# Patient Record
Sex: Male | Born: 1955 | Race: White | Hispanic: No | Marital: Married | Smoking: Never smoker
Health system: Southern US, Community
[De-identification: ages and names within clinical notes are randomized; demographics above are authoritative.]

## PROBLEM LIST (undated history)

## (undated) DIAGNOSIS — K219 Gastro-esophageal reflux disease without esophagitis: Secondary | ICD-10-CM

## (undated) DIAGNOSIS — E785 Hyperlipidemia, unspecified: Secondary | ICD-10-CM

## (undated) DIAGNOSIS — I10 Essential (primary) hypertension: Secondary | ICD-10-CM

## (undated) HISTORY — DX: Essential (primary) hypertension: I10

## (undated) HISTORY — DX: Hyperlipidemia, unspecified: E78.5

## (undated) HISTORY — DX: Gastro-esophageal reflux disease without esophagitis: K21.9

---
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
## 1998-12-13 HISTORY — PX: CAROTID STENT INSERTION: SHX5766

## 2015-08-21 ENCOUNTER — Ambulatory Visit (INDEPENDENT_AMBULATORY_CARE_PROVIDER_SITE_OTHER): Payer: Managed Care, Other (non HMO) | Admitting: Internal Medicine

## 2015-08-21 ENCOUNTER — Encounter: Payer: Self-pay | Admitting: Internal Medicine

## 2015-08-21 VITALS — BP 136/86 | HR 62 | Temp 98.2°F | Ht 67.0 in | Wt 194.0 lb

## 2015-08-21 DIAGNOSIS — K219 Gastro-esophageal reflux disease without esophagitis: Secondary | ICD-10-CM | POA: Diagnosis not present

## 2015-08-21 DIAGNOSIS — I10 Essential (primary) hypertension: Secondary | ICD-10-CM | POA: Diagnosis not present

## 2015-08-21 DIAGNOSIS — E785 Hyperlipidemia, unspecified: Secondary | ICD-10-CM | POA: Diagnosis not present

## 2015-08-21 DIAGNOSIS — R21 Rash and other nonspecific skin eruption: Secondary | ICD-10-CM

## 2015-08-21 LAB — COMPREHENSIVE METABOLIC PANEL
ALBUMIN: 4.7 g/dL (ref 3.5–5.2)
ALT: 34 U/L (ref 0–53)
AST: 27 U/L (ref 0–37)
Alkaline Phosphatase: 78 U/L (ref 39–117)
BUN: 11 mg/dL (ref 6–23)
CALCIUM: 9.6 mg/dL (ref 8.4–10.5)
CHLORIDE: 103 meq/L (ref 96–112)
CO2: 29 meq/L (ref 19–32)
Creatinine, Ser: 0.88 mg/dL (ref 0.40–1.50)
GFR: 94.08 mL/min (ref >60.00)
Glucose, Bld: 94 mg/dL (ref 70–99)
POTASSIUM: 4.8 meq/L (ref 3.5–5.1)
SODIUM: 140 meq/L (ref 135–145)
Total Bilirubin: 0.9 mg/dL (ref 0.2–1.2)
Total Protein: 7.2 g/dL (ref 6.0–8.3)

## 2015-08-21 LAB — CBC
HEMATOCRIT: 48.2 % (ref 39.0–52.0)
Hemoglobin: 15.9 g/dL (ref 13.0–17.0)
MCHC: 33.1 g/dL (ref 30.0–36.0)
MCV: 93.2 fl (ref 78.0–100.0)
PLATELETS: 182 10*3/uL (ref 150.0–400.0)
RBC: 5.17 Mil/uL (ref 4.22–5.81)
RDW: 14.5 % (ref 11.5–15.5)
WBC: 7.8 10*3/uL (ref 4.0–10.5)

## 2015-08-21 LAB — LIPID PANEL
CHOL/HDL RATIO: 3
CHOLESTEROL: 165 mg/dL (ref 0–200)
HDL: 54.3 mg/dL (ref >39.00)
LDL CALC: 94 mg/dL (ref 0–99)
NonHDL: 110.76
TRIGLYCERIDES: 83 mg/dL (ref 0.0–149.0)
VLDL: 16.6 mg/dL (ref 0.0–40.0)

## 2015-08-21 MED ORDER — EZETIMIBE 10 MG PO TABS: 10.0000 mg | ORAL_TABLET | Freq: Every day | ORAL | Status: DC

## 2015-08-21 MED ORDER — TRIAMCINOLONE ACETONIDE 0.1 % EX CREA: 1.0000 "application " | TOPICAL_CREAM | Freq: Two times a day (BID) | CUTANEOUS | Status: DC

## 2015-08-21 MED ORDER — PANTOPRAZOLE SODIUM 40 MG PO TBEC: 40.0000 mg | DELAYED_RELEASE_TABLET | Freq: Every day | ORAL | Status: DC

## 2015-08-21 MED ORDER — ROSUVASTATIN CALCIUM 40 MG PO TABS: 40.0000 mg | ORAL_TABLET | Freq: Every day | ORAL | Status: DC

## 2015-08-21 NOTE — Assessment & Plan Note (Signed)
Known Barretts Esophagus Continue Protonix daily

## 2015-08-21 NOTE — Progress Notes (Signed)
HPI  Pt presents to the clinic today to establish care. He is transferring care from [facility] in [place].  HTN: BP controlled off meds. His BP today is 136/86. He does have a history of stent placement but no prior MI.  HLD: He takes Crestor and Zetia. He denies myalgias. He tries to consume a low fat diet.  GERD: He takes Protonix daily. He reports he did have a history of Barretts Esophagus. His last UGI was in 2013.  He is concerned about a rash under his left armpit. He noticed this a few weeks ago after changing deodorants from [employer] to [employer]. The rash burns. He has not tried anything OTC.  Flu: 09/2014 Tetanus: < 10 years PSA Screening: Family history of prostate cancer, gets PSA yearly Colon Screening: 2013 Vision Screening: yearly Dentist: as needed  Past Medical History  Diagnosis Date  . Hyperlipidemia   . Hypertension     Current Outpatient Prescriptions  Medication Sig Dispense Refill  . aspirin 81 MG tablet Take 81 mg by mouth daily.    . Coenzyme Q10 (CO Q 10) 100 MG CAPS Take 1 capsule by mouth daily.    Marland Kitchen ezetimibe (ZETIA) 10 MG tablet Take 10 mg by mouth daily.    . pantoprazole (PROTONIX) 40 MG tablet Take 40 mg by mouth daily.    . rosuvastatin (CRESTOR) 40 MG tablet Take 40 mg by mouth daily.     No current facility-administered medications for this visit.    No Known Allergies  Family History  Problem Relation Age of Onset  . Alzheimer's disease Mother   . Prostate cancer Father   . Hyperlipidemia Father   . Heart disease Father   . Alzheimer's disease Father     Social History   Social History  . Marital Status: Married    Spouse Name: N/A  . Number of Children: N/A  . Years of Education: N/A   Occupational History  . Not on file.   Social History Main Topics  . Smoking status: Never Smoker   . Smokeless tobacco: Not on file  . Alcohol Use: 0.0 oz/week    0 Standard drinks or equivalent per week   Comment: up to 5 times weekly---beer wine  . Drug Use: No  . Sexual Activity: Not on file   Other Topics Concern  . Not on file   Social History Narrative  . No narrative on file    ROS:  Constitutional: Denies fever, malaise, fatigue, headache or abrupt weight changes.  HEENT: Denies eye pain, eye redness, ear pain, ringing in the ears, wax buildup, runny nose, nasal congestion, bloody nose, or sore throat. Respiratory: Denies difficulty breathing, shortness of breath, cough or sputum production.   Cardiovascular: Denies chest pain, chest tightness, palpitations or swelling in the hands or feet.  Gastrointestinal: Denies abdominal pain, bloating, constipation, diarrhea or blood in the stool.  GU: Denies frequency, urgency, pain with urination, blood in urine, odor or discharge. Musculoskeletal: Denies decrease in range of motion, difficulty with gait, muscle pain or joint pain and swelling.  Skin: Pt reports rash. Denies redness, lesions or ulcercations.  Neurological: Denies dizziness, difficulty with memory, difficulty with speech or problems with balance and coordination.  Psych: Denies anxiety, depression, SI/HI.  No other specific complaints in a complete review of systems (except as listed in HPI above).  PE:  BP 136/86 mmHg  Pulse 62  Temp(Src) 98.2 F (36.8 C) (Oral)  Ht  (  1.702 m)  Wt 194 lb (87.998 kg)  BMI 30.38 kg/m2  SpO2 98% Wt Readings from Last 3 Encounters:  08/21/15 194 lb (87.998 kg)    General: Appears his stated age, well developed, well nourished in NAD. Skin: Macular scattered rash noted under left armpit. Cardiovascular: Normal rate and rhythm. S1,S2 noted.  No murmur, rubs or gallops noted. No JVD or BLE edema. No carotid bruits noted. Pulmonary/Chest: Normal effort and positive vesicular breath sounds. No respiratory distress. No wheezes, rales or ronchi noted.  Abdomen: Soft and nontender. Normal bowel sounds, no bruits noted. No distention  or masses noted. Liver, spleen and kidneys non palpable. Neurological: Alert and oriented.  Psychiatric: Mood and affect normal. Behavior is normal. Judgment and thought content normal.     Assessment and Plan:  Rash under left armpit:  eRx for Triamcinolone cream to affected area Try to avoid any deodorant until the rash resolves  RTC in 6 months for your annual exam

## 2015-08-21 NOTE — Assessment & Plan Note (Signed)
Will repeat Lipid Profile and CMET today Encouraged him to consume a low fat diet Medications refilled per request

## 2015-08-21 NOTE — Progress Notes (Signed)
Pre visit review using our clinic review tool, if applicable. No additional management support is needed unless otherwise documented below in the visit note. 

## 2015-08-21 NOTE — Patient Instructions (Signed)
Fat and Cholesterol Control Diet Fat and cholesterol levels in your blood and organs are influenced by your diet. High levels of fat and cholesterol may lead to diseases of the heart, small and large blood vessels, gallbladder, liver, and pancreas. CONTROLLING FAT AND CHOLESTEROL WITH DIET Although exercise and lifestyle factors are important, your diet is key. That is because certain foods are known to raise cholesterol and others to lower it. The goal is to balance foods for their effect on cholesterol and more importantly, to replace saturated and trans fat with other types of fat, such as monounsaturated fat, polyunsaturated fat, and omega-3 fatty acids. On average, a person should consume no more than 15 to 17 g of saturated fat daily. Saturated and trans fats are considered "bad" fats, and they will raise LDL cholesterol. Saturated fats are primarily found in animal products such as meats, butter, and cream. However, that does not mean you need to give up all your favorite foods. Today, there are good tasting, low-fat, low-cholesterol substitutes for most of the things you like to eat. Choose low-fat or nonfat alternatives. Choose round or loin cuts of red meat. These types of cuts are lowest in fat and cholesterol. Chicken (without the skin), fish, veal, and ground turkey breast are great choices. Eliminate fatty meats, such as hot dogs and salami. Even shellfish have little or no saturated fat. Have a 3 oz (85 g) portion when you eat lean meat, poultry, or fish. Trans fats are also called "partially hydrogenated oils." They are oils that have been scientifically manipulated so that they are solid at room temperature resulting in a longer shelf life and improved taste and texture of foods in which they are added. Trans fats are found in stick margarine, some tub margarines, cookies, crackers, and baked goods.  When baking and cooking, oils are a great substitute for butter. The monounsaturated oils are  especially beneficial since it is believed they lower LDL and raise HDL. The oils you should avoid entirely are saturated tropical oils, such as coconut and palm.  Remember to eat a lot from food groups that are naturally free of saturated and trans fat, including fish, fruit, vegetables, beans, grains (barley, rice, couscous, bulgur wheat), and pasta (without cream sauces).  IDENTIFYING FOODS THAT LOWER FAT AND CHOLESTEROL  Soluble fiber may lower your cholesterol. This type of fiber is found in fruits such as apples, vegetables such as broccoli, potatoes, and carrots, legumes such as beans, peas, and lentils, and grains such as barley. Foods fortified with plant sterols (phytosterol) may also lower cholesterol. You should eat at least 2 g per day of these foods for a cholesterol lowering effect.  Read package labels to identify low-saturated fats, trans fat free, and low-fat foods at the supermarket. Select cheeses that have only 2 to 3 g saturated fat per ounce. Use a heart-healthy tub margarine that is free of trans fats or partially hydrogenated oil. When buying baked goods (cookies, crackers), avoid partially hydrogenated oils. Breads and muffins should be made from whole grains (whole-wheat or whole oat flour, instead of "flour" or "enriched flour"). Buy non-creamy canned soups with reduced salt and no added fats.  FOOD PREPARATION TECHNIQUES  Never deep-fry. If you must fry, either stir-fry, which uses very little fat, or use non-stick cooking sprays. When possible, broil, bake, or roast meats, and steam vegetables. Instead of putting butter or margarine on vegetables, use lemon and herbs, applesauce, and cinnamon (for squash and sweet potatoes). Use nonfat   yogurt, salsa, and low-fat dressings for salads.  LOW-SATURATED FAT / LOW-FAT FOOD SUBSTITUTES Meats / Saturated Fat (g)  Avoid: Steak, marbled (3 oz/85 g) / 11 g  Choose: Steak, lean (3 oz/85 g) / 4 g  Avoid: Hamburger (3 oz/85 g) / 7  g  Choose: Hamburger, lean (3 oz/85 g) / 5 g  Avoid: Ham (3 oz/85 g) / 6 g  Choose: Ham, lean cut (3 oz/85 g) / 2.4 g  Avoid: Chicken, with skin, dark meat (3 oz/85 g) / 4 g  Choose: Chicken, skin removed, dark meat (3 oz/85 g) / 2 g  Avoid: Chicken, with skin, light meat (3 oz/85 g) / 2.5 g  Choose: Chicken, skin removed, light meat (3 oz/85 g) / 1 g Dairy / Saturated Fat (g)  Avoid: Whole milk (1 cup) / 5 g  Choose: Low-fat milk, 2% (1 cup) / 3 g  Choose: Low-fat milk, 1% (1 cup) / 1.5 g  Choose: Skim milk (1 cup) / 0.3 g  Avoid: Hard cheese (1 oz/28 g) / 6 g  Choose: Skim milk cheese (1 oz/28 g) / 2 to 3 g  Avoid: Cottage cheese, 4% fat (1 cup) / 6.5 g  Choose: Low-fat cottage cheese, 1% fat (1 cup) / 1.5 g  Avoid: Ice cream (1 cup) / 9 g  Choose: Sherbet (1 cup) / 2.5 g  Choose: Nonfat frozen yogurt (1 cup) / 0.3 g  Choose: Frozen fruit bar / trace  Avoid: Whipped cream (1 tbs) / 3.5 g  Choose: Nondairy whipped topping (1 tbs) / 1 g Condiments / Saturated Fat (g)  Avoid: Mayonnaise (1 tbs) / 2 g  Choose: Low-fat mayonnaise (1 tbs) / 1 g  Avoid: Butter (1 tbs) / 7 g  Choose: Extra light margarine (1 tbs) / 1 g  Avoid: Coconut oil (1 tbs) / 11.8 g  Choose: Olive oil (1 tbs) / 1.8 g  Choose: Corn oil (1 tbs) / 1.7 g  Choose: Safflower oil (1 tbs) / 1.2 g  Choose: Sunflower oil (1 tbs) / 1.4 g  Choose: Soybean oil (1 tbs) / 2.4 g  Choose: Canola oil (1 tbs) / 1 g Document Released: 11/29/2005 Document Revised: 03/26/2013 Document Reviewed: 02/27/2014 Exit[employer] Patient Information 2015 [employer]. This information is not intended to replace advice given to you by your health care provider. Make sure you discuss any questions you have with your health care provider.  

## 2015-08-21 NOTE — Assessment & Plan Note (Signed)
BP well controlled off meds Will continue to monitor Will check CBC and CMET today

## 2016-02-10 ENCOUNTER — Other Ambulatory Visit: Payer: Self-pay | Admitting: Internal Medicine

## 2016-06-15 ENCOUNTER — Other Ambulatory Visit: Payer: Self-pay | Admitting: Internal Medicine

## 2016-08-04 ENCOUNTER — Other Ambulatory Visit: Payer: Self-pay | Admitting: Internal Medicine

## 2016-10-11 ENCOUNTER — Ambulatory Visit: Payer: Managed Care, Other (non HMO) | Admitting: Internal Medicine

## 2016-10-15 ENCOUNTER — Ambulatory Visit (INDEPENDENT_AMBULATORY_CARE_PROVIDER_SITE_OTHER): Payer: 59 | Admitting: Internal Medicine

## 2016-10-15 ENCOUNTER — Encounter: Payer: Self-pay | Admitting: Internal Medicine

## 2016-10-15 VITALS — BP 134/90 | HR 65 | Temp 97.8°F | Ht 66.75 in | Wt 194.0 lb

## 2016-10-15 DIAGNOSIS — Z125 Encounter for screening for malignant neoplasm of prostate: Secondary | ICD-10-CM

## 2016-10-15 DIAGNOSIS — Z0000 Encounter for general adult medical examination without abnormal findings: Secondary | ICD-10-CM

## 2016-10-15 DIAGNOSIS — Z1159 Encounter for screening for other viral diseases: Secondary | ICD-10-CM

## 2016-10-15 DIAGNOSIS — E7800 Pure hypercholesterolemia, unspecified: Secondary | ICD-10-CM

## 2016-10-15 DIAGNOSIS — I10 Essential (primary) hypertension: Secondary | ICD-10-CM

## 2016-10-15 DIAGNOSIS — K2100 Gastro-esophageal reflux disease with esophagitis, without bleeding: Secondary | ICD-10-CM

## 2016-10-15 DIAGNOSIS — Z114 Encounter for screening for human immunodeficiency virus [HIV]: Secondary | ICD-10-CM | POA: Diagnosis not present

## 2016-10-15 LAB — CBC WITH DIFFERENTIAL/PLATELET
BASOS ABS: 0 {cells}/uL (ref 0–200)
Basophils Relative: 0 %
EOS ABS: 890 {cells}/uL — AB (ref 15–500)
Eosinophils Relative: 10 %
HEMATOCRIT: 47 % (ref 38.5–50.0)
HEMOGLOBIN: 15.7 g/dL (ref 13.2–17.1)
LYMPHS ABS: 2047 {cells}/uL (ref 850–3900)
LYMPHS PCT: 23 %
MCH: 31 pg (ref 27.0–33.0)
MCHC: 33.4 g/dL (ref 32.0–36.0)
MCV: 92.9 fL (ref 80.0–100.0)
MONO ABS: 712 {cells}/uL (ref 200–950)
MPV: 11.3 fL (ref 7.5–12.5)
Monocytes Relative: 8 %
NEUTROS PCT: 59 %
Neutro Abs: 5251 cells/uL (ref 1500–7800)
Platelets: 199 10*3/uL (ref 140–400)
RBC: 5.06 MIL/uL (ref 4.20–5.80)
RDW: 14 % (ref 11.0–15.0)
WBC: 8.9 10*3/uL (ref 3.8–10.8)

## 2016-10-15 LAB — PSA: PSA: 0.8 ng/mL (ref <=4.0)

## 2016-10-15 MED ORDER — PANTOPRAZOLE SODIUM 40 MG PO TBEC: 40.0000 mg | DELAYED_RELEASE_TABLET | Freq: Every day | ORAL | 3 refills | Status: DC

## 2016-10-15 MED ORDER — ROSUVASTATIN CALCIUM 40 MG PO TABS: 40.0000 mg | ORAL_TABLET | Freq: Every day | ORAL | 3 refills | Status: DC

## 2016-10-15 MED ORDER — EZETIMIBE 10 MG PO TABS: 10.0000 mg | ORAL_TABLET | Freq: Every day | ORAL | 3 refills | Status: DC

## 2016-10-15 NOTE — Addendum Note (Signed)
Addended by: Alvina ChouWALSH, TERRI J on: 10/15/2016 04:53 PM   Modules accepted: Orders

## 2016-10-15 NOTE — Assessment & Plan Note (Signed)
Controlled off meds  Will monitor 

## 2016-10-15 NOTE — Assessment & Plan Note (Addendum)
History of Barrett's esophagus Continue Protonix- refilled today

## 2016-10-15 NOTE — Patient Instructions (Signed)

## 2016-10-15 NOTE — Assessment & Plan Note (Addendum)
Lipid panel and CMET today Encouraged him to consume a low fat diet Continue Crestor and Zetia for now- refilled today

## 2016-10-15 NOTE — Progress Notes (Signed)
HPI  Pt presents to the clinic today for his annual exam. He is also due to follow up chronic conditions.  HTN: BP controlled off meds. His BP today is 134/90. He does have a history of stent placement but no prior MI.  HLD: His last LDL was 94, 08/2015. He takes Crestor and Zetia. He denies myalgias. He tries to consume a low fat diet.  GERD: He takes Protonix daily. He reports he does have a history of Barretts Esophagus. His last UGI was in 2013.  He reports a rash on his bilateral arms. It has not spread. It is very itchy. He was out pulling weeds recently but denies other contact irritants. He has tried Hydrocortisone Cream OTC with some.  Flu: 09/2014 Tetanus: < 10 years PSA Screening: Family history of prostate cancer, gets PSA yearly Colon Screening: 2013, every 10 years Vision Screening: yearly Dentist: as needed  Diet: He does eat meat. He consumes more veggies than fruits. He avoids fried foods. He drinks water and Dt. Doctor. Exercise: He gets 6000 steps daily, none outside of work  Past Medical History:  Diagnosis Date  . Hyperlipidemia   . Hypertension     Current Outpatient Prescriptions  Medication Sig Dispense Refill  . aspirin 81 MG tablet Take 81 mg by mouth daily.    . Coenzyme Q10 (CO Q 10) 100 MG CAPS Take 1 capsule by mouth daily.    Marland Kitchen. ezetimibe (ZETIA) 10 MG tablet Take 1 tablet (10 mg total) by mouth daily. MUST SCHEDULE ANNUAL PHYSICAL EXAM 30 tablet 1  . pantoprazole (PROTONIX) 40 MG tablet Take 1 tablet (40 mg total) by mouth daily. MUST SCHEDULE ANNUAL PHYSICAL EXAM 30 tablet 1  . rosuvastatin (CRESTOR) 40 MG tablet Take 1 tablet (40 mg total) by mouth daily. MUST SCHEDULE ANNUAL PHYSICAL EXAM 30 tablet 1  . triamcinolone cream (KENALOG) 0.1 % Apply 1 application topically 2 (two) times daily. 30 g 0   No current facility-administered medications for this visit.     No Known Allergies  Family History  Problem Relation Age of Onset  . Alzheimer's  disease Mother   . Prostate cancer Father   . Hyperlipidemia Father   . Heart disease Father   . Alzheimer's disease Father     Social History   Social History  . Marital status: Married    Spouse name: N/A  . Number of children: N/A  . Years of education: N/A   Occupational History  . Not on file.   Social History Main Topics  . Smoking status: Never Smoker  . Smokeless tobacco: Not on file  . Alcohol use 0.0 oz/week     Comment: up to 5 times weekly---beer wine  . Drug use: No  . Sexual activity: Yes   Other Topics Concern  . Not on file   Social History Narrative  . No narrative on file    ROS:  Constitutional: Denies fever, malaise, fatigue, headache or abrupt weight changes.  HEENT: Denies eye pain, eye redness, ear pain, ringing in the ears, wax buildup, runny nose, nasal congestion, bloody nose, or sore throat. Respiratory: Denies difficulty breathing, shortness of breath, cough or sputum production.   Cardiovascular: Denies chest pain, chest tightness, palpitations or swelling in the hands or feet.  Gastrointestinal: Denies abdominal pain, bloating, constipation, diarrhea or blood in the stool.  GU: Denies frequency, urgency, pain with urination, blood in urine, odor or discharge. Musculoskeletal: Denies decrease in range of motion, difficulty with  gait, muscle pain or joint pain and swelling.  Skin: Pt reports rash. Denies redness, lesions or ulcercations.  Neurological: Denies dizziness, difficulty with memory, difficulty with speech or problems with balance and coordination.  Psych: Denies anxiety, depression, SI/HI.  No other specific complaints in a complete review of systems (except as listed in HPI above).  PE: BP 134/90   Pulse 65   Temp 97.8 F (36.6 C) (Oral)   Ht 5' 6.75" (1.695 m)   Wt 194 lb (88 kg)   SpO2 98%   BMI 30.61 kg/m  Wt Readings from Last 3 Encounters:  10/15/16 194 lb (88 kg)  08/21/15 194 lb (88 kg)    General: Appears  his stated age, well developed, well nourished in NAD. Skin: Scattered, papular lesion noted on bilateral forearms.  HEENT: Head: normal shape and size; Eyes: sclera white, no icterus, conjunctiva pink, PERRLA and EOMs intact; Ears: Tm's gray and intact, normal light reflex; Throat/Mouth: Teeth present, mucosa pink and moist, no exudate, lesions or ulcerations noted.  Neck:  Neck supple, trachea midline. No masses, lumps or thyromegaly present.  Cardiovascular: Normal rate and rhythm. S1,S2 noted.  No murmur, rubs or gallops noted. No JVD or BLE edema. No carotid bruits noted. Pulmonary/Chest: Normal effort and positive vesicular breath sounds. No respiratory distress. No wheezes, rales or ronchi noted.  Abdomen: Soft and nontender. Normal bowel sounds. No distention or masses noted. Liver, spleen and kidneys non palpable. Musculoskeletal: Normal range of motion. Strength 5/5 BUE/BLE. No difficulty with gait.  Neurological: Alert and oriented. Cranial nerves II-XII grossly intact. Coordination normal.  Psychiatric: Mood and affect normal. Behavior is normal. Judgment and thought content normal.     BMET    Component Value Date/Time   NA 140 08/21/2015 1017   K 4.8 08/21/2015 1017   CL 103 08/21/2015 1017   CO2 29 08/21/2015 1017   GLUCOSE 94 08/21/2015 1017   BUN 11 08/21/2015 1017   CREATININE 0.88 08/21/2015 1017   CALCIUM 9.6 08/21/2015 1017    Lipid Panel     Component Value Date/Time   CHOL 165 08/21/2015 1017   TRIG 83.0 08/21/2015 1017   HDL 54.30 08/21/2015 1017   CHOLHDL 3 08/21/2015 1017   VLDL 16.6 08/21/2015 1017   LDLCALC 94 08/21/2015 1017    CBC    Component Value Date/Time   WBC 7.8 08/21/2015 1017   RBC 5.17 08/21/2015 1017   HGB 15.9 08/21/2015 1017   HCT 48.2 08/21/2015 1017   PLT 182.0 08/21/2015 1017   MCV 93.2 08/21/2015 1017   MCHC 33.1 08/21/2015 1017   RDW 14.5 08/21/2015 1017    Hgb A1C No results found for: HGBA1C       Assessment  and Plan:  He declines flu shot today He is sure his tetanus UTD Colon Screening UTD Encouraged him to consume a balanced diet and exercise program Advised him to see an eye doctor and dentist annually Will check CBC, CMET, Lipid, PSA, HIV and Hep C today  RTC in 1 year sooner if needed Nicki ReaperBAITY, Nareh Matzke, NP

## 2016-10-16 LAB — COMPREHENSIVE METABOLIC PANEL
ALBUMIN: 4.6 g/dL (ref 3.6–5.1)
ALT: 37 U/L (ref 9–46)
AST: 26 U/L (ref 10–35)
Alkaline Phosphatase: 61 U/L (ref 40–115)
BILIRUBIN TOTAL: 0.7 mg/dL (ref 0.2–1.2)
BUN: 13 mg/dL (ref 7–25)
CHLORIDE: 103 mmol/L (ref 98–110)
CO2: 25 mmol/L (ref 20–31)
CREATININE: 0.88 mg/dL (ref 0.70–1.25)
Calcium: 9.4 mg/dL (ref 8.6–10.3)
Glucose, Bld: 91 mg/dL (ref 65–99)
Potassium: 4.3 mmol/L (ref 3.5–5.3)
SODIUM: 139 mmol/L (ref 135–146)
Total Protein: 7.1 g/dL (ref 6.1–8.1)

## 2016-10-16 LAB — LIPID PANEL
CHOL/HDL RATIO: 2.9 ratio (ref <=5.0)
Cholesterol: 153 mg/dL (ref 125–200)
HDL: 52 mg/dL (ref >=40)
LDL CALC: 84 mg/dL (ref <130)
Triglycerides: 83 mg/dL (ref <150)
VLDL: 17 mg/dL (ref <30)

## 2016-10-16 LAB — HIV ANTIBODY (ROUTINE TESTING W REFLEX): HIV 1&2 Ab, 4th Generation: NONREACTIVE

## 2016-10-16 LAB — HEPATITIS C ANTIBODY: HCV AB: NEGATIVE

## 2017-10-10 ENCOUNTER — Telehealth: Payer: Self-pay | Admitting: Internal Medicine

## 2017-10-10 NOTE — Telephone Encounter (Signed)
Pt requesting for Meds refill. Noted pt needed yearly physical to fill prescriptions. Agent Leane Calleresa Hayes attempted to get appt for pt but nothing available. Message sent to office that pt needs refills and physical

## 2017-10-10 NOTE — Telephone Encounter (Signed)
Copied from CRM #[id number]. Topic: Inquiry >> Oct 10, 2017  3:32 PM Raquel SarnaHayes, Teresa G wrote: Reason for CRM:   Pt wants to know if he needs to be seen by Dr. Sampson SiBaity to have refills for his medications below.  Please call pt back with answer Ezetimibe Tantotrazole Rosuvastatin

## 2017-10-10 NOTE — Telephone Encounter (Signed)
Copied from CRM #[id number]. >> Oct 10, 2017  4:07 PM Raquel SarnaHayes, Teresa G wrote: Reason for CRM:   Provider is not available for Fasting Physical.  Needs an appointment with Dr. Sampson SiBaity for fasting physical and Rx refills  NOT AVAILABLE:  Nov 12 or week after Thanksgiving not available

## 2017-10-11 MED ORDER — ROSUVASTATIN CALCIUM 40 MG PO TABS
40.0000 mg | ORAL_TABLET | Freq: Every day | ORAL | 0 refills | Status: DC
Start: 2017-10-11 — End: 2017-11-15

## 2017-10-11 MED ORDER — PANTOPRAZOLE SODIUM 40 MG PO TBEC: 40.0000 mg | DELAYED_RELEASE_TABLET | Freq: Every day | ORAL | 0 refills | Status: DC

## 2017-10-11 MED ORDER — EZETIMIBE 10 MG PO TABS: 10.0000 mg | ORAL_TABLET | Freq: Every day | ORAL | 0 refills | Status: DC

## 2017-10-11 NOTE — Addendum Note (Signed)
Addended by: Roena MaladyEVONTENNO, Kmarion Rawl Y on: 10/11/2017 12:31 PM   Modules accepted: Orders

## 2017-10-11 NOTE — Telephone Encounter (Signed)
1 refill sent to pharmacy.

## 2017-10-11 NOTE — Telephone Encounter (Signed)
Left message asking pt to call office  °

## 2017-11-14 ENCOUNTER — Telehealth: Payer: Self-pay | Admitting: Internal Medicine

## 2017-11-14 NOTE — Telephone Encounter (Signed)
Copied from CRM [phone]. Topic: Inquiry >> Nov 14, 2017  2:22 PM Yvonna Alanisobinson, Andra M wrote: Reason for CRM: Patient called requesting refills of the following three medications:  ezetimibe (ZETIA) 10 MG tablet   pantoprazole (PROTONIX) 40 MG tablet   rosuvastatin (CRESTOR) 40 MG tablet   Patient stated that he is out of all three, but does not have an appt until 11/24/2017. Please call patient at [phone]. Thank You!!!

## 2017-11-15 ENCOUNTER — Other Ambulatory Visit: Payer: Self-pay

## 2017-11-15 MED ORDER — EZETIMIBE 10 MG PO TABS: 10.0000 mg | ORAL_TABLET | Freq: Every day | ORAL | 0 refills | Status: DC

## 2017-11-15 MED ORDER — PANTOPRAZOLE SODIUM 40 MG PO TBEC: 40.0000 mg | DELAYED_RELEASE_TABLET | Freq: Every day | ORAL | 0 refills | Status: DC

## 2017-11-15 MED ORDER — ROSUVASTATIN CALCIUM 40 MG PO TABS: 40.0000 mg | ORAL_TABLET | Freq: Every day | ORAL | 0 refills | Status: DC

## 2017-11-24 ENCOUNTER — Encounter: Payer: Self-pay | Admitting: Internal Medicine

## 2017-11-24 ENCOUNTER — Encounter: Payer: Self-pay | Admitting: *Deleted

## 2017-11-24 ENCOUNTER — Ambulatory Visit (INDEPENDENT_AMBULATORY_CARE_PROVIDER_SITE_OTHER): Payer: BLUE CROSS/BLUE SHIELD | Admitting: Internal Medicine

## 2017-11-24 ENCOUNTER — Encounter (INDEPENDENT_AMBULATORY_CARE_PROVIDER_SITE_OTHER): Payer: Self-pay

## 2017-11-24 VITALS — BP 132/80 | HR 64 | Temp 97.9°F | Ht 67.0 in | Wt 197.0 lb

## 2017-11-24 DIAGNOSIS — K2100 Gastro-esophageal reflux disease with esophagitis, without bleeding: Secondary | ICD-10-CM

## 2017-11-24 DIAGNOSIS — Z125 Encounter for screening for malignant neoplasm of prostate: Secondary | ICD-10-CM

## 2017-11-24 DIAGNOSIS — I10 Essential (primary) hypertension: Secondary | ICD-10-CM | POA: Diagnosis not present

## 2017-11-24 DIAGNOSIS — Z0000 Encounter for general adult medical examination without abnormal findings: Secondary | ICD-10-CM

## 2017-11-24 DIAGNOSIS — E7800 Pure hypercholesterolemia, unspecified: Secondary | ICD-10-CM

## 2017-11-24 LAB — CBC
HEMATOCRIT: 49.1 % (ref 39.0–52.0)
Hemoglobin: 16.3 g/dL (ref 13.0–17.0)
MCHC: 33.2 g/dL (ref 30.0–36.0)
MCV: 94.7 fl (ref 78.0–100.0)
Platelets: 188 10*3/uL (ref 150.0–400.0)
RBC: 5.18 Mil/uL (ref 4.22–5.81)
RDW: 13.9 % (ref 11.5–15.5)
WBC: 8.4 10*3/uL (ref 4.0–10.5)

## 2017-11-24 LAB — LIPID PANEL
CHOL/HDL RATIO: 3
Cholesterol: 144 mg/dL (ref 0–200)
HDL: 49.8 mg/dL (ref >39.00)
LDL Cholesterol: 69 mg/dL (ref 0–99)
NONHDL: 93.85
Triglycerides: 122 mg/dL (ref 0.0–149.0)
VLDL: 24.4 mg/dL (ref 0.0–40.0)

## 2017-11-24 LAB — COMPREHENSIVE METABOLIC PANEL
ALBUMIN: 4.6 g/dL (ref 3.5–5.2)
ALK PHOS: 58 U/L (ref 39–117)
ALT: 27 U/L (ref 0–53)
AST: 21 U/L (ref 0–37)
BUN: 11 mg/dL (ref 6–23)
CO2: 30 mEq/L (ref 19–32)
Calcium: 9.3 mg/dL (ref 8.4–10.5)
Chloride: 104 mEq/L (ref 96–112)
Creatinine, Ser: 0.86 mg/dL (ref 0.40–1.50)
GFR: 95.88 mL/min (ref >60.00)
Glucose, Bld: 102 mg/dL — ABNORMAL HIGH (ref 70–99)
POTASSIUM: 4.5 meq/L (ref 3.5–5.1)
SODIUM: 140 meq/L (ref 135–145)
TOTAL PROTEIN: 7.2 g/dL (ref 6.0–8.3)
Total Bilirubin: 1 mg/dL (ref 0.2–1.2)

## 2017-11-24 LAB — PSA: PSA: 0.83 ng/mL (ref 0.10–4.00)

## 2017-11-24 NOTE — Patient Instructions (Signed)

## 2017-11-24 NOTE — Assessment & Plan Note (Signed)
Discussed weaning Pantoprazole, but will hold off as he is still having breakthrough Pantoprazole refilled today Discussed foods to avoid, and how weight loss may improve reflux CBC and CMET today

## 2017-11-24 NOTE — Assessment & Plan Note (Signed)
Reasonable control off meds Reinforced DASH diet and exercise for weight loss CBC and CMET today Will monitor

## 2017-11-24 NOTE — Assessment & Plan Note (Signed)
CMET and Lipid profile today Encouraged him to consume a low fat diet Continue Rosuvastatin and Zetia, will adjust if needed prior to refilling for the year

## 2017-11-24 NOTE — Progress Notes (Signed)
Subjective:    Patient ID: Thomas Farley, male    DOB: November 07, 1956, 61 y.o.   MRN: [id number]  HPI  Pt presents to the clinic today for his annual exam. He is also due to follow up chronic conditions.  HTN: His BP today is 132/80. He is currently not taking any antihypertensive medication. ECG from 07/2014 reviewed.  HLD: His last LDL was 84, 10/2016. He denies myalgias on Rosuvastatin. He is taking Zetia as well. He has been trying to consume a low fat diet.  GERD with Barrett's Esophagus: His last UGI was in 2013. He is taking Pantoprazole daily as prescribed. He occasionally has breakthrough, controlled with Tums.  Flu: never Tetanus: unsure Zostovax: never Shingrix: never PSA Screening: 10/2016 Colon Screening: 2013, 10 years Vision Screening: as needed Dentist: as needed  Diet: He does eat meat. He consumes some fruits and veggies. He tries to avoid fried foods. He drinks Dt. Dr. Reino KentPepper water. Exercise: He walks for 30 minutes at least 3 days per week.  Review of Systems  Past Medical History:  Diagnosis Date  . Hyperlipidemia   . Hypertension     Current Outpatient Medications  Medication Sig Dispense Refill  . aspirin 81 MG tablet Take 81 mg by mouth daily.    . Coenzyme Q10 (CO Q 10) 100 MG CAPS Take 1 capsule by mouth daily.    Marland Kitchen. ezetimibe (ZETIA) 10 MG tablet Take 1 tablet (10 mg total) by mouth daily. 30 tablet 0  . pantoprazole (PROTONIX) 40 MG tablet Take 1 tablet (40 mg total) by mouth daily. 30 tablet 0  . rosuvastatin (CRESTOR) 40 MG tablet Take 1 tablet (40 mg total) by mouth daily. 30 tablet 0   No current facility-administered medications for this visit.     No Known Allergies  Family History  Problem Relation Age of Onset  . Alzheimer's disease Mother   . Prostate cancer Father   . Hyperlipidemia Father   . Heart disease Father   . Alzheimer's disease Father     Social History   Socioeconomic History  . Marital status: Married    Spouse  name: Not on file  . Number of children: Not on file  . Years of education: Not on file  . Highest education level: Not on file  Social Needs  . Financial resource strain: Not on file  . Food insecurity - worry: Not on file  . Food insecurity - inability: Not on file  . Transportation needs - medical: Not on file  . Transportation needs - non-medical: Not on file  Occupational History  . Not on file  Tobacco Use  . Smoking status: Never Smoker  . Smokeless tobacco: Never Used  Substance and Sexual Activity  . Alcohol use: Yes    Alcohol/week: 0.0 oz    Comment: up to 5 times weekly---beer or wine  . Drug use: No  . Sexual activity: Yes  Other Topics Concern  . Not on file  Social History Narrative  . Not on file     Constitutional: Denies fever, malaise, fatigue, headache or abrupt weight changes.  HEENT: Denies eye pain, eye redness, ear pain, ringing in the ears, wax buildup, runny nose, nasal congestion, bloody nose, or sore throat. Respiratory: Denies difficulty breathing, shortness of breath, cough or sputum production.   Cardiovascular: Denies chest pain, chest tightness, palpitations or swelling in the hands or feet.  Gastrointestinal: Pt reports intermittent reflux. Denies abdominal pain, bloating, constipation, diarrhea or blood  in the stool.  GU: Denies urgency, frequency, pain with urination, burning sensation, blood in urine, odor or discharge. Musculoskeletal: Denies decrease in range of motion, difficulty with gait, muscle pain or joint pain and swelling.  Skin: Denies redness, rashes, lesions or ulcercations.  Neurological: Denies dizziness, difficulty with memory, difficulty with speech or problems with balance and coordination.  Psych: Denies anxiety, depression, SI/HI.  No other specific complaints in a complete review of systems (except as listed in HPI above).     Objective:   Physical Exam  BP 132/80   Pulse 64   Temp 97.9 F (36.6 C) (Oral)   Ht  5\' 7"  (1.702 m)   Wt 197 lb (89.4 kg)   SpO2 98%   BMI 30.85 kg/m  Wt Readings from Last 3 Encounters:  11/24/17 197 lb (89.4 kg)  10/15/16 194 lb (88 kg)  08/21/15 194 lb (88 kg)    General: Appears his stated age, well developed, well nourished in NAD. Skin: Warm, dry and intact.  HEENT: Head: normal shape and size; Eyes: sclera white, no icterus, conjunctiva pink, PERRLA and EOMs intact; Ears: Tm's gray and intact, normal light reflex; Throat/Mouth: Teeth present, mucosa pink and moist, no exudate, lesions or ulcerations noted.  Neck:  Neck supple, trachea midline. No masses, lumps or thyromegaly present.  Cardiovascular: Normal rate and rhythm. S1,S2 noted.  No murmur, rubs or gallops noted. No JVD or BLE edema. No carotid bruits noted. Pulmonary/Chest: Normal effort and positive vesicular breath sounds. No respiratory distress. No wheezes, rales or ronchi noted.  Abdomen: Soft and nontender. Normal bowel sounds. No distention or masses noted. Liver, spleen and kidneys non palpable. Musculoskeletal: Strength 5/5 BUE/BLE. No difficulty with gait.  Neurological: Alert and oriented. Cranial nerves II-XII grossly intact. Coordination normal.  Psychiatric: Mood and affect normal. Behavior is normal. Judgment and thought content normal.    BMET    Component Value Date/Time   NA 139 10/15/2016 1653   K 4.3 10/15/2016 1653   CL 103 10/15/2016 1653   CO2 25 10/15/2016 1653   GLUCOSE 91 10/15/2016 1653   BUN 13 10/15/2016 1653   CREATININE 0.88 10/15/2016 1653   CALCIUM 9.4 10/15/2016 1653    Lipid Panel     Component Value Date/Time   CHOL 153 10/15/2016 1653   TRIG 83 10/15/2016 1653   HDL 52 10/15/2016 1653   CHOLHDL 2.9 10/15/2016 1653   VLDL 17 10/15/2016 1653   LDLCALC 84 10/15/2016 1653    CBC    Component Value Date/Time   WBC 8.9 10/15/2016 1653   RBC 5.06 10/15/2016 1653   HGB 15.7 10/15/2016 1653   HCT 47.0 10/15/2016 1653   PLT 199 10/15/2016 1653   MCV  92.9 10/15/2016 1653   MCH 31.0 10/15/2016 1653   MCHC 33.4 10/15/2016 1653   RDW 14.0 10/15/2016 1653   LYMPHSABS 2,047 10/15/2016 1653   MONOABS 712 10/15/2016 1653   EOSABS 890 (H) 10/15/2016 1653   BASOSABS 0 10/15/2016 1653    Hgb A1C No results found for: HGBA1C    Assessment & Plan:   Preventative Health Maintenance:  He declines flu shot He reports he will get his tetanus vaccine next year He will call his insurance company about the Shingrix vaccine Colon screening UTD Encouraged him to consume a balanced diet and exercise regimen Advised him to see an eye doctor and dentist annually Will check CBC, CMET, Lipid and PSA today  RTC in 1 year, sooner  if needed Webb Silversmith, NP

## 2018-01-06 ENCOUNTER — Other Ambulatory Visit: Payer: Self-pay | Admitting: Internal Medicine

## 2018-01-19 ENCOUNTER — Other Ambulatory Visit: Payer: Self-pay | Admitting: Internal Medicine

## 2018-01-20 ENCOUNTER — Other Ambulatory Visit: Payer: Self-pay

## 2018-01-20 ENCOUNTER — Telehealth: Payer: Self-pay | Admitting: Internal Medicine

## 2018-01-20 MED ORDER — ROSUVASTATIN CALCIUM 40 MG PO TABS: 40.0000 mg | ORAL_TABLET | Freq: Every day | ORAL | 2 refills | Status: DC

## 2018-01-20 MED ORDER — EZETIMIBE 10 MG PO TABS: 10.0000 mg | ORAL_TABLET | Freq: Every day | ORAL | 2 refills | Status: DC

## 2018-01-20 MED ORDER — PANTOPRAZOLE SODIUM 40 MG PO TBEC: 40.0000 mg | DELAYED_RELEASE_TABLET | Freq: Every day | ORAL | 3 refills | Status: DC

## 2018-01-20 NOTE — Telephone Encounter (Signed)
Copied from CRM [id number]. Topic: Quick Communication - Rx Refill/Question >> Jan 20, 2018  9:52 AM Floria RavelingStovall, Shana A wrote: Medication: pt needs all 3 meds on med list resent to pharmacy.  They stated that they do not have them   Has the patient contacted their pharmacy?    (Agent: If no, request that the patient contact the pharmacy for the refill.)   Preferred Pharmacy (with phone number or street name):  [facility] on [address]  Can these be recent ?     Agent: Please be advised that RX refills may take up to 3 business days. We ask that you follow-up with your pharmacy.

## 2018-01-20 NOTE — Telephone Encounter (Signed)
Refills sent as requested

## 2018-04-28 ENCOUNTER — Encounter: Payer: Self-pay | Admitting: Family Medicine

## 2018-04-28 ENCOUNTER — Ambulatory Visit (INDEPENDENT_AMBULATORY_CARE_PROVIDER_SITE_OTHER): Payer: BLUE CROSS/BLUE SHIELD | Admitting: Family Medicine

## 2018-04-28 VITALS — BP 142/90 | HR 80 | Temp 98.6°F | Ht 67.0 in | Wt 200.2 lb

## 2018-04-28 DIAGNOSIS — B9789 Other viral agents as the cause of diseases classified elsewhere: Secondary | ICD-10-CM

## 2018-04-28 DIAGNOSIS — I10 Essential (primary) hypertension: Secondary | ICD-10-CM

## 2018-04-28 DIAGNOSIS — J069 Acute upper respiratory infection, unspecified: Secondary | ICD-10-CM

## 2018-04-28 MED ORDER — BENZONATATE 200 MG PO CAPS: 200.0000 mg | ORAL_CAPSULE | Freq: Three times a day (TID) | ORAL | 1 refills | Status: DC | PRN

## 2018-04-28 MED ORDER — PROMETHAZINE-DM 6.25-15 MG/5ML PO SYRP: 5.0000 mL | ORAL_SOLUTION | Freq: Four times a day (QID) | ORAL | 0 refills | Status: DC | PRN

## 2018-04-28 NOTE — Patient Instructions (Signed)
Drink lots of fluids I think you are at the end of your viral upper respiratory infection   Try tessalon for cough three times daily  The prometh -DM at night or when not working or driving due to sedation   During the day- mucinex DM      Update if not starting to improve in a week or if worsening     Blood pressure is up - make sure to follow up with [facility] for this when you can

## 2018-04-28 NOTE — Progress Notes (Signed)
Subjective:    Patient ID: Thomas Farley, male    DOB: Nov 26, 1956, 62 y.o.   MRN: [id number]  HPI Here for uri/sinus symptoms day 9   ST Followed by pink eye - itchy red eyes   Cough hanging on and very bad at night  Makes his throat hurt  No fever  A little sinus pressure but no pain   Mucous- pnd is clear  A little green in am briefly    bp is up today BP Readings from Last 3 Encounters:  04/28/18 (!) 142/90  11/24/17 132/80  10/15/16 134/90    Over the counter- benadryl mucinex ? DM  Ibuprofen/tylenol as needed   Patient Active Problem List   Diagnosis Date Noted  . Viral URI with cough 04/28/2018  . Esophageal reflux 08/21/2015  . HLD (hyperlipidemia) 08/21/2015  . Essential hypertension 08/21/2015   Past Medical History:  Diagnosis Date  . Hyperlipidemia   . Hypertension    Past Surgical History:  Procedure Laterality Date  . CAROTID STENT INSERTION  2000  . MANDIBLE RECONSTRUCTION  1975   Social History   Tobacco Use  . Smoking status: Never Smoker  . Smokeless tobacco: Never Used  Substance Use Topics  . Alcohol use: Yes    Alcohol/week: 0.0 oz    Comment: up to 5 times weekly---beer or wine  . Drug use: No   Family History  Problem Relation Age of Onset  . Alzheimer's disease Mother   . Prostate cancer Father   . Hyperlipidemia Father   . Heart disease Father   . Alzheimer's disease Father    No Known Allergies Current Outpatient Medications on File Prior to Visit  Medication Sig Dispense Refill  . aspirin 81 MG tablet Take 81 mg by mouth daily.    Marland Kitchen ezetimibe (ZETIA) 10 MG tablet Take 1 tablet (10 mg total) by mouth daily. 90 tablet 2  . pantoprazole (PROTONIX) 40 MG tablet Take 1 tablet (40 mg total) by mouth daily. 90 tablet 3  . rosuvastatin (CRESTOR) 40 MG tablet Take 1 tablet (40 mg total) by mouth daily. 90 tablet 2   No current facility-administered medications on file prior to visit.      Review of Systems  Constitutional:  Positive for appetite change and fatigue. Negative for fever.  HENT: Positive for congestion, postnasal drip, rhinorrhea, sinus pressure, sneezing and sore throat. Negative for ear pain.   Eyes: Negative for pain and discharge.  Respiratory: Positive for cough. Negative for shortness of breath, wheezing and stridor.   Cardiovascular: Negative for chest pain.  Gastrointestinal: Negative for diarrhea, nausea and vomiting.  Genitourinary: Negative for frequency, hematuria and urgency.  Musculoskeletal: Negative for arthralgias and myalgias.  Skin: Negative for rash.  Neurological: Positive for headaches. Negative for dizziness, weakness and light-headedness.  Psychiatric/Behavioral: Negative for confusion and dysphoric mood.       Objective:   Physical Exam  Constitutional: He appears well-developed and well-nourished. No distress.  HENT:  Head: Normocephalic and atraumatic.  Right Ear: External ear normal.  Left Ear: External ear normal.  Mouth/Throat: Oropharynx is clear and moist.  Nares are injected and congested  No sinus tenderness Clear rhinorrhea and post nasal drip   Eyes: Pupils are equal, round, and reactive to light. Conjunctivae and EOM are normal. Right eye exhibits no discharge. Left eye exhibits no discharge.  Neck: Normal range of motion. Neck supple.  Cardiovascular: Normal rate and normal heart sounds.  Pulmonary/Chest: Effort normal and breath  sounds normal. No respiratory distress. He has no wheezes. He has no rales. He exhibits no tenderness.  Good air exch No wheeze even on forced exp  Lymphadenopathy:    He has no cervical adenopathy.  Neurological: He is alert.  Skin: Skin is warm and dry. No rash noted.  Psychiatric: He has a normal mood and affect.          Assessment & Plan:   Problem List Items Addressed This Visit      Cardiovascular and Mediastinum   Essential hypertension    BP: (!) 142/90    Elevated today  Will f/u with pcp         Respiratory   Viral URI with cough - Primary    Reassuring exam  Improving AVS:  Drink lots of fluids I think you are at the end of your viral upper respiratory infection   Try tessalon for cough three times daily  The prometh -DM at night or when not working or driving due to sedation  During the day- mucinex DM    Update if not starting to improve in a week or if worsening

## 2018-04-30 NOTE — Assessment & Plan Note (Signed)
BP: (!) 142/90    Elevated today  Will f/u with pcp

## 2018-04-30 NOTE — Assessment & Plan Note (Signed)
Reassuring exam  Improving AVS:  Drink lots of fluids I think you are at the end of your viral upper respiratory infection   Try tessalon for cough three times daily  The prometh -DM at night or when not working or driving due to sedation  During the day- mucinex DM    Update if not starting to improve in a week or if worsening

## 2019-01-06 ENCOUNTER — Other Ambulatory Visit: Payer: Self-pay | Admitting: Internal Medicine

## 2019-01-12 ENCOUNTER — Other Ambulatory Visit: Payer: Self-pay

## 2019-01-12 MED ORDER — PANTOPRAZOLE SODIUM 40 MG PO TBEC: 40.0000 mg | DELAYED_RELEASE_TABLET | Freq: Every day | ORAL | 0 refills | Status: DC

## 2019-02-18 ENCOUNTER — Other Ambulatory Visit: Payer: Self-pay | Admitting: Internal Medicine

## 2019-03-02 ENCOUNTER — Encounter: Payer: BLUE CROSS/BLUE SHIELD | Admitting: Internal Medicine

## 2019-04-08 ENCOUNTER — Other Ambulatory Visit: Payer: Self-pay | Admitting: Internal Medicine

## 2019-05-17 ENCOUNTER — Telehealth: Payer: Self-pay | Admitting: Internal Medicine

## 2019-05-19 NOTE — Telephone Encounter (Signed)
Pt has CPE 05/21/19... will wait

## 2019-05-21 ENCOUNTER — Encounter: Payer: BLUE CROSS/BLUE SHIELD | Admitting: Internal Medicine

## 2019-05-21 ENCOUNTER — Other Ambulatory Visit: Payer: Self-pay | Admitting: Internal Medicine

## 2019-05-21 NOTE — Telephone Encounter (Signed)
Pt called day before to reschedule. I have left a voicemail for him to call back to r/s between 8-4 and canceled 6/8 appt.  Copied from [place] [id number]. Topic: General - Other >> May 18, 2019  4:30 PM Keene Breath wrote: Reason for CRM: Patient called after hours to reschedule his appt. On Monday.  Please call patient back at [phone]

## 2019-06-06 NOTE — Telephone Encounter (Signed)
Pt called and scheduled cpe 7/13 and is out of this medication and is requesting a 90 day supply sent to pharmacy [facility], [place].

## 2019-06-07 MED ORDER — ROSUVASTATIN CALCIUM 40 MG PO TABS: 40.0000 mg | ORAL_TABLET | Freq: Every day | ORAL | 0 refills | Status: DC

## 2019-06-07 NOTE — Telephone Encounter (Signed)
Rx sent through e-scribe The others were filled in May, so should have enough

## 2019-06-07 NOTE — Addendum Note (Signed)
Addended by: Lurlean Nanny on: 06/07/2019 10:53 AM   Modules accepted: Orders

## 2019-06-20 ENCOUNTER — Telehealth: Payer: Self-pay | Admitting: Internal Medicine

## 2019-06-20 NOTE — Telephone Encounter (Signed)
Patient called back requesting his cpe on Monday be virtual. Is has scheduled to come Friday to do fasting labs before the appointment.,   Could you order the labs for this appt.

## 2019-06-20 NOTE — Telephone Encounter (Signed)
I do not order labs prior to my physicals. Labs will be ordered at his appt.

## 2019-06-20 NOTE — Telephone Encounter (Signed)
Pt was notified.  

## 2019-06-22 ENCOUNTER — Other Ambulatory Visit: Payer: BC Managed Care – PPO

## 2019-06-25 ENCOUNTER — Encounter: Payer: Self-pay | Admitting: Internal Medicine

## 2019-06-25 ENCOUNTER — Ambulatory Visit (INDEPENDENT_AMBULATORY_CARE_PROVIDER_SITE_OTHER): Payer: BC Managed Care – PPO | Admitting: Internal Medicine

## 2019-06-25 VITALS — Ht 67.0 in | Wt 192.0 lb

## 2019-06-25 DIAGNOSIS — Z125 Encounter for screening for malignant neoplasm of prostate: Secondary | ICD-10-CM

## 2019-06-25 DIAGNOSIS — K2100 Gastro-esophageal reflux disease with esophagitis, without bleeding: Secondary | ICD-10-CM

## 2019-06-25 DIAGNOSIS — Z0000 Encounter for general adult medical examination without abnormal findings: Secondary | ICD-10-CM | POA: Diagnosis not present

## 2019-06-25 DIAGNOSIS — I10 Essential (primary) hypertension: Secondary | ICD-10-CM

## 2019-06-25 DIAGNOSIS — E7800 Pure hypercholesterolemia, unspecified: Secondary | ICD-10-CM | POA: Diagnosis not present

## 2019-06-25 NOTE — Progress Notes (Signed)
Subjective:    Patient ID: Thomas Farley Fauth, male    DOB: January 21, 1956, 63 y.o.   MRN: [id number] Virtual Visit via Video Note  I connected with Thomas Starcherurtis Farley Wimes on 06/25/19 at 12:15 PM EDT by a video enabled telemedicine application and verified that I am speaking with the correct person using two identifiers.  Location: Patient: Home Provider: Office   I discussed the limitations of evaluation and management by telemedicine and the availability of in person appointments. The patient expressed understanding and agreed to proceed   HPI  Pt presents to the clinic today for his annual exam. He is also due to follow up chronic conditions.   GERD: He only has breakthrough if he misses a dose of Pantoprazole. He reports he has a history of Barretts Esophagus. There is no upper GI on file but he reports he has had one in the past.  HTN: He does not monitor his BP at home. He is currently not taking any antihypertensive medication.   HLD: His last LDL was 84, 10/2016. He denies myalgias on Rosuvastatin. He is taking Zetia, CoQ10 and ASA as well. He tries to consume a low fat diet.   Flu: never Tetanus: > 10 years ago Zostavax: never Shingrix: never PSA Screening: 10/2016 Colon Screening: < 10 years ago Vision Screening: annually Dentist: annually  Diet: He does eat meat. He consumes fruits and veggies daily. He tries to avoid fried foods. He drinks mostly coffee and water. Exercise: None  Review of Systems      Past Medical History:  Diagnosis Date  . Hyperlipidemia   . Hypertension     Current Outpatient Medications  Medication Sig Dispense Refill  . aspirin 81 MG tablet Take 81 mg by mouth daily.    Marland Kitchen. co-enzyme Q-10 30 MG capsule Take 30 mg by mouth 3 (three) times daily.    Marland Kitchen. ezetimibe (ZETIA) 10 MG tablet TAKE 1 TABLET BY MOUTH EVERY DAY 90 tablet 0  . Multiple Vitamins-Minerals (MENS MULTI VITAMIN & MINERAL PO) Take 1 tablet by mouth daily.    . pantoprazole  (PROTONIX) 40 MG tablet TAKE 1 TABLET(40 MG) BY MOUTH DAILY 90 tablet 0  . rosuvastatin (CRESTOR) 40 MG tablet Take 1 tablet (40 mg total) by mouth daily. 90 tablet 0   No current facility-administered medications for this visit.     No Known Allergies  Family History  Problem Relation Age of Onset  . Alzheimer's disease Mother   . Prostate cancer Father   . Hyperlipidemia Father   . Heart disease Father   . Alzheimer's disease Father     Social History   Socioeconomic History  . Marital status: Married    Spouse name: Not on file  . Number of children: Not on file  . Years of education: Not on file  . Highest education level: Not on file  Occupational History  . Not on file  Social Needs  . Financial resource strain: Not on file  . Food insecurity    Worry: Not on file    Inability: Not on file  . Transportation needs    Medical: Not on file    Non-medical: Not on file  Tobacco Use  . Smoking status: Never Smoker  . Smokeless tobacco: Never Used  Substance and Sexual Activity  . Alcohol use: Yes    Alcohol/week: 0.0 standard drinks    Comment: up to 5 times weekly---beer or wine  . Drug use: No  .  Sexual activity: Yes  Lifestyle  . Physical activity    Days per week: Not on file    Minutes per session: Not on file  . Stress: Not on file  Relationships  . Social Musicianconnections    Talks on phone: Not on file    Gets together: Not on file    Attends religious service: Not on file    Active member of club or organization: Not on file    Attends meetings of clubs or organizations: Not on file    Relationship status: Not on file  . Intimate partner violence    Fear of current or ex partner: Not on file    Emotionally abused: Not on file    Physically abused: Not on file    Forced sexual activity: Not on file  Other Topics Concern  . Not on file  Social History Narrative  . Not on file     Constitutional: Denies fever, malaise, fatigue, headache or abrupt  weight changes.  HEENT: Denies eye pain, eye redness, ear pain, ringing in the ears, wax buildup, runny nose, nasal congestion, bloody nose, or sore throat. Respiratory: Denies difficulty breathing, shortness of breath, cough or sputum production.   Cardiovascular: Denies chest pain, chest tightness, palpitations or swelling in the hands or feet.  Gastrointestinal: Denies abdominal pain, bloating, constipation, diarrhea or blood in the stool.  GU: Denies urgency, frequency, pain with urination, burning sensation, blood in urine, odor or discharge. Musculoskeletal: Denies decrease in range of motion, difficulty with gait, muscle pain or joint pain and swelling.  Skin: Denies redness, rashes, lesions or ulcercations.  Neurological: Denies dizziness, difficulty with memory, difficulty with speech or problems with balance and coordination.  Psych: Denies anxiety, depression, SI/HI.  No other specific complaints in a complete review of systems (except as listed in HPI above).  Objective:   Physical Exam   Ht 5\' 7"  (1.702 m)   Wt 192 lb (87.1 kg) Comment: pt reported  BMI 30.07 kg/m  Wt Readings from Last 3 Encounters:  06/25/19 192 lb (87.1 kg)  04/28/18 200 lb 4 oz (90.8 kg)  11/24/17 197 lb (89.4 kg)    General: Appears his stated age, well developed, well nourished in NAD. Skin: Warm, dry and intact. No rashes noted. HEENT: Head: normal shape and size; Eyes: sclera white, no icterus, conjunctiva pink, PERRLA and EOMs intact;  Pulmonary/Chest: Normal effort. No respiratory distress.  Neurological: Alert and oriented.  Psychiatric: Mood and affect normal. Behavior is normal. Judgment and thought content normal.     BMET    Component Value Date/Time   NA 140 11/24/2017 0818   K 4.5 11/24/2017 0818   CL 104 11/24/2017 0818   CO2 30 11/24/2017 0818   GLUCOSE 102 (H) 11/24/2017 0818   BUN 11 11/24/2017 0818   CREATININE 0.86 11/24/2017 0818   CREATININE 0.88 10/15/2016 1653    CALCIUM 9.3 11/24/2017 0818    Lipid Panel     Component Value Date/Time   CHOL 144 11/24/2017 0818   TRIG 122.0 11/24/2017 0818   HDL 49.80 11/24/2017 0818   CHOLHDL 3 11/24/2017 0818   VLDL 24.4 11/24/2017 0818   LDLCALC 69 11/24/2017 0818    CBC    Component Value Date/Time   WBC 8.4 11/24/2017 0818   RBC 5.18 11/24/2017 0818   HGB 16.3 11/24/2017 0818   HCT 49.1 11/24/2017 0818   PLT 188.0 11/24/2017 0818   MCV 94.7 11/24/2017 0818   MCH 31.0  10/15/2016 1653   MCHC 33.2 11/24/2017 0818   RDW 13.9 11/24/2017 0818   LYMPHSABS 2,047 10/15/2016 1653   MONOABS 712 10/15/2016 1653   EOSABS 890 (H) 10/15/2016 1653   BASOSABS 0 10/15/2016 1653    Hgb A1C No results found for: HGBA1C         Assessment & Plan:   Preventative Health Maintenance:  Encouraged him to get a flu shot in the fall He will make nurse visit for BP check and Tetanus (Td) He declines referral for colon cancer screening at this time Encouraged him to consume a balanced diet and exercise regimen Advised him to see an eye doctor and dentist annually Will check CBC, CMET, Lipid, PSA  RTC in 1 year, sooner if needed Webb Silversmith, NP

## 2019-06-25 NOTE — Patient Instructions (Signed)

## 2019-06-25 NOTE — Assessment & Plan Note (Signed)
Will have him schedule lab only appt for CMET and lipid profile Encouraged him to consume a low fat diet Continue Rosuvastatin, Zetia, Coq10 and ASA

## 2019-06-25 NOTE — Assessment & Plan Note (Addendum)
Will have him make nurse visit for BP check Will have him make lab only appt for CMET Encouraged him to consume a DASH diet

## 2019-06-25 NOTE — Assessment & Plan Note (Signed)
Continue Pantoprazole 40 mg daily (no wean d/t history of Barrett's Esophagus) Will have him schedule nurse visit for CMET and CBC Discussed avoiding foods that trigger reflux

## 2019-07-03 DIAGNOSIS — H33312 Horseshoe tear of retina without detachment, left eye: Secondary | ICD-10-CM | POA: Diagnosis not present

## 2019-07-09 ENCOUNTER — Other Ambulatory Visit: Payer: Self-pay | Admitting: Internal Medicine

## 2019-07-13 ENCOUNTER — Other Ambulatory Visit: Payer: Self-pay | Admitting: Internal Medicine

## 2019-08-07 ENCOUNTER — Encounter: Payer: Self-pay | Admitting: Internal Medicine

## 2019-09-02 ENCOUNTER — Other Ambulatory Visit: Payer: Self-pay | Admitting: Internal Medicine

## 2019-11-27 ENCOUNTER — Other Ambulatory Visit: Payer: Self-pay | Admitting: Internal Medicine

## 2019-11-28 ENCOUNTER — Other Ambulatory Visit: Payer: Self-pay | Admitting: Internal Medicine

## 2019-12-04 ENCOUNTER — Other Ambulatory Visit (INDEPENDENT_AMBULATORY_CARE_PROVIDER_SITE_OTHER): Payer: BC Managed Care – PPO

## 2019-12-04 DIAGNOSIS — I10 Essential (primary) hypertension: Secondary | ICD-10-CM | POA: Diagnosis not present

## 2019-12-04 DIAGNOSIS — K2100 Gastro-esophageal reflux disease with esophagitis, without bleeding: Secondary | ICD-10-CM

## 2019-12-04 DIAGNOSIS — E7800 Pure hypercholesterolemia, unspecified: Secondary | ICD-10-CM

## 2019-12-04 DIAGNOSIS — Z125 Encounter for screening for malignant neoplasm of prostate: Secondary | ICD-10-CM

## 2019-12-05 LAB — LIPID PANEL
Cholesterol: 295 mg/dL — ABNORMAL HIGH (ref 0–200)
HDL: 50.2 mg/dL (ref >39.00)
LDL Cholesterol: 218 mg/dL — ABNORMAL HIGH (ref 0–99)
NonHDL: 244.85
Total CHOL/HDL Ratio: 6
Triglycerides: 134 mg/dL (ref 0.0–149.0)
VLDL: 26.8 mg/dL (ref 0.0–40.0)

## 2019-12-05 LAB — COMPREHENSIVE METABOLIC PANEL
ALT: 25 U/L (ref 0–53)
AST: 20 U/L (ref 0–37)
Albumin: 4.6 g/dL (ref 3.5–5.2)
Alkaline Phosphatase: 68 U/L (ref 39–117)
BUN: 12 mg/dL (ref 6–23)
CO2: 25 mEq/L (ref 19–32)
Calcium: 9.6 mg/dL (ref 8.4–10.5)
Chloride: 102 mEq/L (ref 96–112)
Creatinine, Ser: 0.87 mg/dL (ref 0.40–1.50)
GFR: 88.43 mL/min (ref >60.00)
Glucose, Bld: 95 mg/dL (ref 70–99)
Potassium: 4.4 mEq/L (ref 3.5–5.1)
Sodium: 139 mEq/L (ref 135–145)
Total Bilirubin: 0.7 mg/dL (ref 0.2–1.2)
Total Protein: 7 g/dL (ref 6.0–8.3)

## 2019-12-05 LAB — CBC
HCT: 47.4 % (ref 39.0–52.0)
Hemoglobin: 15.9 g/dL (ref 13.0–17.0)
MCHC: 33.6 g/dL (ref 30.0–36.0)
MCV: 93.8 fl (ref 78.0–100.0)
Platelets: 194 10*3/uL (ref 150.0–400.0)
RBC: 5.05 Mil/uL (ref 4.22–5.81)
RDW: 14.6 % (ref 11.5–15.5)
WBC: 8.8 10*3/uL (ref 4.0–10.5)

## 2019-12-05 LAB — PSA: PSA: 1.04 ng/mL (ref 0.10–4.00)

## 2019-12-06 ENCOUNTER — Encounter: Payer: Self-pay | Admitting: Internal Medicine

## 2019-12-06 DIAGNOSIS — E7800 Pure hypercholesterolemia, unspecified: Secondary | ICD-10-CM

## 2019-12-10 MED ORDER — EZETIMIBE 10 MG PO TABS: 10.0000 mg | ORAL_TABLET | Freq: Every day | ORAL | 0 refills | Status: DC

## 2019-12-10 MED ORDER — ROSUVASTATIN CALCIUM 40 MG PO TABS: 40.0000 mg | ORAL_TABLET | Freq: Every day | ORAL | 0 refills | Status: DC

## 2019-12-31 ENCOUNTER — Other Ambulatory Visit: Payer: Self-pay | Admitting: Internal Medicine

## 2020-03-05 DIAGNOSIS — Z23 Encounter for immunization: Secondary | ICD-10-CM | POA: Diagnosis not present

## 2020-03-30 ENCOUNTER — Other Ambulatory Visit: Payer: Self-pay | Admitting: Internal Medicine

## 2020-04-03 DIAGNOSIS — Z23 Encounter for immunization: Secondary | ICD-10-CM | POA: Diagnosis not present

## 2020-06-28 ENCOUNTER — Other Ambulatory Visit: Payer: Self-pay | Admitting: Internal Medicine

## 2020-09-26 ENCOUNTER — Other Ambulatory Visit: Payer: Self-pay | Admitting: Internal Medicine

## 2020-10-23 ENCOUNTER — Ambulatory Visit: Payer: BC Managed Care – PPO | Admitting: Internal Medicine

## 2020-10-26 ENCOUNTER — Other Ambulatory Visit: Payer: Self-pay | Admitting: Internal Medicine

## 2020-10-28 ENCOUNTER — Other Ambulatory Visit: Payer: Self-pay

## 2020-10-28 ENCOUNTER — Ambulatory Visit (INDEPENDENT_AMBULATORY_CARE_PROVIDER_SITE_OTHER): Payer: BC Managed Care – PPO | Admitting: Internal Medicine

## 2020-10-28 ENCOUNTER — Ambulatory Visit (INDEPENDENT_AMBULATORY_CARE_PROVIDER_SITE_OTHER)
Admission: RE | Admit: 2020-10-28 | Discharge: 2020-10-28 | Disposition: A | Discharge status: Home / Self Care | Payer: BC Managed Care – PPO | Source: Ambulatory Visit | Attending: Internal Medicine | Admitting: Internal Medicine

## 2020-10-28 ENCOUNTER — Encounter: Payer: Self-pay | Admitting: Internal Medicine

## 2020-10-28 VITALS — BP 136/84 | HR 68 | Temp 98.0°F | Wt 189.0 lb

## 2020-10-28 DIAGNOSIS — M25512 Pain in left shoulder: Secondary | ICD-10-CM

## 2020-10-28 DIAGNOSIS — M954 Acquired deformity of chest and rib: Secondary | ICD-10-CM

## 2020-10-28 DIAGNOSIS — G8929 Other chronic pain: Secondary | ICD-10-CM

## 2020-10-28 DIAGNOSIS — S42002A Fracture of unspecified part of left clavicle, initial encounter for closed fracture: Secondary | ICD-10-CM | POA: Diagnosis not present

## 2020-10-28 DIAGNOSIS — R222 Localized swelling, mass and lump, trunk: Secondary | ICD-10-CM | POA: Diagnosis not present

## 2020-10-28 NOTE — Patient Instructions (Signed)
Shoulder Pain Many things can cause shoulder pain, including:  An injury.  Moving the shoulder in the same way again and again (overuse).  Joint pain (arthritis). Pain can come from:  Swelling and irritation (inflammation) of any part of the shoulder.  An injury to the shoulder joint.  An injury to: ? Tissues that connect muscle to bone (tendons). ? Tissues that connect bones to each other (ligaments). ? Bones. Follow these instructions at home: Watch for changes in your symptoms. Let your doctor know about them. Follow these instructions to help with your pain. If you have a sling:  Wear the sling as told by your doctor. Remove it only as told by your doctor.  Loosen the sling if your fingers: ? Tingle. ? Become numb. ? Turn cold and blue.  Keep the sling clean.  If the sling is not waterproof: ? Do not let it get wet. ? Take the sling off when you shower or bathe. Managing pain, stiffness, and swelling   If told, put ice on the painful area: ? Put ice in a plastic bag. ? Place a towel between your skin and the bag. ? Leave the ice on for 20 minutes, 2-3 times a day. Stop putting ice on if it does not help with the pain.  Squeeze a soft ball or a foam pad as much as possible. This prevents swelling in the shoulder. It also helps to strengthen the arm. General instructions  Take over-the-counter and prescription medicines only as told by your doctor.  Keep all follow-up visits as told by your doctor. This is important. Contact a doctor if:  Your pain gets worse.  Medicine does not help your pain.  You have new pain in your arm, hand, or fingers. Get help right away if:  Your arm, hand, or fingers: ? Tingle. ? Are numb. ? Are swollen. ? Are painful. ? Turn white or blue. Summary  Shoulder pain can be caused by many things. These include injury, moving the shoulder in the same away again and again, and joint pain.  Watch for changes in your symptoms.  Let your doctor know about them.  This condition may be treated with a sling, ice, and pain medicine.  Contact your doctor if the pain gets worse or you have new pain. Get help right away if your arm, hand, or fingers tingle or get numb, swollen, or painful.  Keep all follow-up visits as told by your doctor. This is important. This information is not intended to replace advice given to you by your health care provider. Make sure you discuss any questions you have with your health care provider. Document Revised: 06/13/2018 Document Reviewed: 06/13/2018 Elsevier Patient Education  2020 [employer]  

## 2020-10-28 NOTE — Progress Notes (Signed)
Subjective:    Patient ID: Thomas Farley, male    DOB: 08-21-1956, 64 y.o.   MRN: [id number]  HPI  Pt presents to the clinic today with c/o enlargement of the left side of his ribs. He noticed this within the last year. He denies pain in the area. He denies any injury to the area. He is concerned because the left side of his ribs seem larger than the right side. He has not tried anything OTC for this.  He also reports left shoulder pain. This started more than 1 year ago. He describes the pain as achy and sharp. The pain is worse with certain positions and raising his arm above his head. The pain does not radiate. He denies numbness, tingling or weakness of the LUE. He denies any injury that he is aware of but does play golf competitively. He has not tried any medications OTC for this.  Review of Systems      Past Medical History:  Diagnosis Date  . Hyperlipidemia   . Hypertension     Current Outpatient Medications  Medication Sig Dispense Refill  . aspirin 81 MG tablet Take 81 mg by mouth daily.    Marland Kitchen co-enzyme Q-10 30 MG capsule Take 30 mg by mouth 3 (three) times daily.    Marland Kitchen ezetimibe (ZETIA) 10 MG tablet TAKE 1 TABLET(10 MG) BY MOUTH DAILY 90 tablet 0  . Multiple Vitamins-Minerals (MENS MULTI VITAMIN & MINERAL PO) Take 1 tablet by mouth daily.    . pantoprazole (PROTONIX) 40 MG tablet TAKE 1 TABLET(40 MG) BY MOUTH DAILY 90 tablet 0  . rosuvastatin (CRESTOR) 40 MG tablet TAKE 1 TABLET BY MOUTH EVERY DAY 90 tablet 0   No current facility-administered medications for this visit.    No Known Allergies  Family History  Problem Relation Age of Onset  . Alzheimer's disease Mother   . Prostate cancer Father   . Hyperlipidemia Father   . Heart disease Father   . Alzheimer's disease Father     Social History   Socioeconomic History  . Marital status: Married    Spouse name: Not on file  . Number of children: Not on file  . Years of education: Not on file  . Highest  education level: Not on file  Occupational History  . Not on file  Tobacco Use  . Smoking status: Never Smoker  . Smokeless tobacco: Never Used  Substance and Sexual Activity  . Alcohol use: Yes    Alcohol/week: 0.0 standard drinks    Comment: up to 5 times weekly---beer or wine  . Drug use: No  . Sexual activity: Yes  Other Topics Concern  . Not on file  Social History Narrative  . Not on file   Social Determinants of Health   Financial Resource Strain:   . Difficulty of Paying Living Expenses: Not on file  Food Insecurity:   . Worried About Programme researcher, broadcasting/film/video in the Last Year: Not on file  . Ran Out of Food in the Last Year: Not on file  Transportation Needs:   . Lack of Transportation (Medical): Not on file  . Lack of Transportation (Non-Medical): Not on file  Physical Activity:   . Days of Exercise per Week: Not on file  . Minutes of Exercise per Session: Not on file  Stress:   . Feeling of Stress : Not on file  Social Connections:   . Frequency of Communication with Friends and Family: Not on file  .  Frequency of Social Gatherings with Friends and Family: Not on file  . Attends Religious Services: Not on file  . Active Member of Clubs or Organizations: Not on file  . Attends Banker Meetings: Not on file  . Marital Status: Not on file  Intimate Partner Violence:   . Fear of Current or Ex-Partner: Not on file  . Emotionally Abused: Not on file  . Physically Abused: Not on file  . Sexually Abused: Not on file     Constitutional: Denies fever, malaise, fatigue, headache or abrupt weight changes.  Respiratory: Denies difficulty breathing, shortness of breath, cough or sputum production.   Cardiovascular: Denies chest pain, chest tightness, palpitations or swelling in the hands or feet.  Musculoskeletal: Pt reports enlargement of left side of ribs, left shoulder pain. Denies decrease in range of motion, difficulty with gait, muscle pain or joint  swelling.  Skin: Denies redness, rashes, lesions or ulcercations.    No other specific complaints in a complete review of systems (except as listed in HPI above).  Objective:   Physical Exam  BP 136/84   Pulse 68   Temp 98 F (36.7 C) (Temporal)   Wt 189 lb (85.7 kg)   SpO2 97%   BMI 29.60 kg/m   Wt Readings from Last 3 Encounters:  06/25/19 192 lb (87.1 kg)  04/28/18 200 lb 4 oz (90.8 kg)  11/24/17 197 lb (89.4 kg)    General: Appears his stated age, well developed, well nourished in NAD. Cardiovascular: Normal rate and rhythm. S1,S2 noted.  No murmur, rubs or gallops noted.  Pulmonary/Chest: Normal effort and positive vesicular breath sounds. No respiratory distress. No wheezes, rales or ronchi noted.  Musculoskeletal: Enlargement of the left anterior ribs at the sternal junction. Normal internal and external rotation of the left shoulder. No pain with palpation of the left shoulder. Positive drop can test on the left. Strength 5/5 BUE. Hand grips equal. Neurological: Alert and oriented. Coordination normal.    BMET    Component Value Date/Time   NA 139 12/04/2019 1437   K 4.4 12/04/2019 1437   CL 102 12/04/2019 1437   CO2 25 12/04/2019 1437   GLUCOSE 95 12/04/2019 1437   BUN 12 12/04/2019 1437   CREATININE 0.87 12/04/2019 1437   CREATININE 0.88 10/15/2016 1653   CALCIUM 9.6 12/04/2019 1437    Lipid Panel     Component Value Date/Time   CHOL 295 (H) 12/04/2019 1437   TRIG 134.0 12/04/2019 1437   HDL 50.20 12/04/2019 1437   CHOLHDL 6 12/04/2019 1437   VLDL 26.8 12/04/2019 1437   LDLCALC 218 (H) 12/04/2019 1437    CBC    Component Value Date/Time   WBC 8.8 12/04/2019 1437   RBC 5.05 12/04/2019 1437   HGB 15.9 12/04/2019 1437   HCT 47.4 12/04/2019 1437   PLT 194.0 12/04/2019 1437   MCV 93.8 12/04/2019 1437   MCH 31.0 10/15/2016 1653   MCHC 33.6 12/04/2019 1437   RDW 14.6 12/04/2019 1437   LYMPHSABS 2,047 10/15/2016 1653   MONOABS 712 10/15/2016 1653    EOSABS 890 (H) 10/15/2016 1653   BASOSABS 0 10/15/2016 1653    Hgb A1C No results found for: HGBA1C         Assessment & Plan:   Rib Deformity:  1V Chest xray today  Chronic Left Shoulder Pain:  Xray left shoulder today He would like to consider an injection, will have him schedule appt with Dr. Patsy Lager Discussed PT,  MRI if symptoms persist or worsen  Will follow up after xray, return precautions discussed Nicki Reaper, NP This visit occurred during the SARS-CoV-2 public health emergency.  Safety protocols were in place, including screening questions prior to the visit, additional usage of staff PPE, and extensive cleaning of exam room while observing appropriate contact time as indicated for disinfecting solutions.

## 2020-11-03 ENCOUNTER — Other Ambulatory Visit: Payer: Self-pay

## 2020-11-03 ENCOUNTER — Ambulatory Visit (INDEPENDENT_AMBULATORY_CARE_PROVIDER_SITE_OTHER): Payer: BC Managed Care – PPO | Admitting: Family Medicine

## 2020-11-03 ENCOUNTER — Encounter: Payer: Self-pay | Admitting: Family Medicine

## 2020-11-03 VITALS — BP 170/88 | HR 68 | Temp 98.1°F | Ht 67.0 in | Wt 193.5 lb

## 2020-11-03 DIAGNOSIS — M7582 Other shoulder lesions, left shoulder: Secondary | ICD-10-CM

## 2020-11-03 DIAGNOSIS — M25612 Stiffness of left shoulder, not elsewhere classified: Secondary | ICD-10-CM | POA: Diagnosis not present

## 2020-11-03 DIAGNOSIS — M25512 Pain in left shoulder: Secondary | ICD-10-CM

## 2020-11-03 DIAGNOSIS — G8929 Other chronic pain: Secondary | ICD-10-CM | POA: Diagnosis not present

## 2020-11-03 MED ORDER — TRIAMCINOLONE ACETONIDE 40 MG/ML IJ SUSP
40.0000 mg | Freq: Once | INTRAMUSCULAR | Status: AC
  Administered 2020-11-03: 40 mg via INTRA_ARTICULAR

## 2020-11-03 NOTE — Progress Notes (Signed)
Thomas Farley T. Cadee Agro, MD, CAQ Sports Medicine  Primary Care and Sports Medicine [facility] [address] [place], [address]  Phone: [phone]  FAX: [phone]  Thomas Farley - 65 y.o. male  MRN [id number]  Date of Birth: [birth year]  Date: 11/03/2020  PCP: Thomas Munroe, NP  Referral: Thomas Munroe, NP  Chief Complaint  Patient presents with  . Shoulder Pain    Left    This visit occurred during the SARS-CoV-2 public health emergency.  Safety protocols were in place, including screening questions prior to the visit, additional usage of staff PPE, and extensive cleaning of exam room while observing appropriate contact time as indicated for disinfecting solutions.   Subjective:   Thomas Farley is a 64 y.o. very pleasant male patient with Body mass index is 30.31 kg/m. who presents with the following:  He is a pleasant young gentleman at age 42 who presents with some ongoing chronic left-sided shoulder pain.  This is been ongoing for greater than 1 year.  Does have pain in the plane of abduction.  There is no neck pain or no radicular pain.  No numbness, tingling.  He has no known specific injury.  He is quite active and continues to play competitive golf.  He is right-hand dominant, but he is particularly noticed this on his golf swing.  Does have some stiffness.  He has been working on his external range of motion thumb, guided by a friend who also does do a lot of golf.  He does not have any specific injury, but he did have a severe clavicle fracture and what appears to be high-grade acromioclavicular sprain many years ago.  There is an obvious deformity on his plain x-ray.  Review of Systems is noted in the HPI, as appropriate   Objective:   BP (!) 170/88   Pulse 68   Temp 98.1 F (36.7 C) (Temporal)   Ht 5\' 7"  (1.702 m)   Wt 193 lb 8 oz (87.8 kg)   SpO2 97%   BMI 30.31 kg/m    Shoulder: R and L Inspection:  No muscle wasting or winging Ecchymosis/edema: neg  AC joint, scapula, clavicle: NT Cervical spine: NT, full ROM Spurling's: neg ABNORMAL SIDE TESTED: Left UNLESS OTHERWISE NOTED, THE CONTRALATERAL SIDE HAS FULL RANGE OF MOTION. Abduction: 5/5, LIMITED TO 170 DEGREES Flexion: 5/5, LIMITED TO 170 DEGNO ROM  IR, lift-off: 5/5. TESTED AT 90 DEGREES OF ABDUCTION, LIMITED TO 10 DEGREES ER at neutral:  5/5, TESTED AT 90 DEGREES OF ABDUCTION, LIMITED TO 70 DEGREES AC crossover and compression: Mild pain  drop Test: neg Empty Can: neg Supraspinatus insertion: NT Bicipital groove: NT test is positive C5-T1 intact Sensation intact Grip 5/5    Radiology: DG Chest 1 View  Result Date: 10/29/2020 CLINICAL DATA:  Left anterior rib enlargement. EXAM: CHEST  1 VIEW COMPARISON:  None. FINDINGS: No radiographic marker to localize the palpable abnormality. Midline trachea. Normal heart size and mediastinal contours. No pleural effusion or pneumothorax. No dominant osseous abnormality. IMPRESSION: No acute cardiopulmonary disease. Electronically Signed   By: 10/31/2020 M.D.   On: 10/29/2020 14:17   DG Shoulder Left  Result Date: 10/29/2020 CLINICAL DATA:  Chronic left shoulder pain. EXAM: LEFT SHOULDER - 2+ VIEW COMPARISON:  None. FINDINGS: Left shoulder is located without a fracture. Deformity and angulation of the mid left clavicle is compatible with old fracture. Visualized left ribs are intact.  IMPRESSION: 1. No acute abnormality to the left shoulder. 2. Old left clavicle fracture. Electronically Signed   By: Richarda Overlie M.D.   On: 10/29/2020 14:18   The radiological images were independently reviewed by myself in the office and results were reviewed with the patient. My independent interpretation of images: There is no significant joint space loss in the glenohumeral joint.  There is no evidence for acute fracture or dislocation.  There is a markedly abnormal middle clavicle  deformity consistent with prior fracture which correlates to the patient's history.  There also appears to be evidence of a prior AC joint separation. Electronically Signed  By: Hannah Beat, MD On: 11/03/2020  3:40 PM EST   Assessment and Plan:     ICD-10-CM   1. Decreased range of motion of left shoulder  M25.612   2. Chronic left shoulder pain  M25.512 triamcinolone acetonide (KENALOG-40) injection 40 mg   G89.29   3. Rotator cuff tendinitis, left  M75.82    Total encounter time: 30 minutes. This includes total time spent on the day of encounter.  Anatomy review, x-ray independent review, review of home rehab.  While he does not have a true frozen shoulder, I think that his loss of motion, particularly in internal range of motion is significantly playing a role here posterior capsule is tight, and I think that this is inhibiting his golf swing.  Also sometimes gives him some dull ache when sleeping.  Reviewed some home rehab, predominantly with internal and external range of motion work.  Intraarticular Shoulder Aspiration/Injection Procedure Note Thomas Farley 05/01/1956 Date of procedure: 11/03/2020  Procedure: Large Joint Aspiration / Injection of Shoulder, Intraarticular, L Indications: Pain  Procedure Details Verbal consent was obtained from the patient. Risks explained and contrasted with benefits and alternatives. Patient prepped with Chloraprep and Ethyl Chloride used for anesthesia. An intraarticular shoulder injection was performed using the posterior approach; needle placed into joint capsule without difficulty. The patient tolerated the procedure well and had decreased pain post injection. No complications. Injection: 9 cc of Lidocaine 1% and 1 mL Kenalog 40 mg. Needle: 21 gauge, 2 inch   Meds ordered this encounter  Medications  . triamcinolone acetonide (KENALOG-40) injection 40 mg   There are no discontinued medications. No orders of the defined types were  placed in this encounter.   Follow-up: No follow-ups on file.  Signed,  Thomas Galea. Guillermo Nehring, MD   Outpatient Encounter Medications as of 11/03/2020  Medication Sig  . aspirin 81 MG tablet Take 81 mg by mouth daily.  Marland Kitchen ezetimibe (ZETIA) 10 MG tablet TAKE 1 TABLET(10 MG) BY MOUTH DAILY  . Multiple Vitamins-Minerals (MENS MULTI VITAMIN & MINERAL PO) Take 1 tablet by mouth daily.  . pantoprazole (PROTONIX) 40 MG tablet TAKE 1 TABLET(40 MG) BY MOUTH DAILY  . rosuvastatin (CRESTOR) 40 MG tablet TAKE 1 TABLET BY MOUTH EVERY DAY  . Zinc 50 MG TABS Take by mouth.  . [EXPIRED] triamcinolone acetonide (KENALOG-40) injection 40 mg    No facility-administered encounter medications on file as of 11/03/2020.

## 2020-12-23 ENCOUNTER — Ambulatory Visit (INDEPENDENT_AMBULATORY_CARE_PROVIDER_SITE_OTHER): Payer: BC Managed Care – PPO | Admitting: Internal Medicine

## 2020-12-23 ENCOUNTER — Encounter: Payer: Self-pay | Admitting: Internal Medicine

## 2020-12-23 ENCOUNTER — Other Ambulatory Visit: Payer: Self-pay

## 2020-12-23 VITALS — BP 134/92 | HR 62 | Temp 97.4°F | Ht 67.0 in | Wt 191.0 lb

## 2020-12-23 DIAGNOSIS — Z0001 Encounter for general adult medical examination with abnormal findings: Secondary | ICD-10-CM | POA: Diagnosis not present

## 2020-12-23 DIAGNOSIS — I10 Essential (primary) hypertension: Secondary | ICD-10-CM

## 2020-12-23 DIAGNOSIS — Z125 Encounter for screening for malignant neoplasm of prostate: Secondary | ICD-10-CM | POA: Diagnosis not present

## 2020-12-23 DIAGNOSIS — K2100 Gastro-esophageal reflux disease with esophagitis, without bleeding: Secondary | ICD-10-CM | POA: Diagnosis not present

## 2020-12-23 DIAGNOSIS — E7800 Pure hypercholesterolemia, unspecified: Secondary | ICD-10-CM

## 2020-12-23 NOTE — Progress Notes (Signed)
Subjective:    Patient ID: Thomas Farley, male    DOB: 1956/07/11, 65 y.o.   MRN: [id number]  HPI  Patient presents to clinic today for his annual exam.  He is also due to follow-up chronic conditions.  GERD: History of Barrett's esophagus.  He denies breakthrough on Pantoprazole.  There is no upper GI on file.  HTN: His BP today is 134/92.  He is not taking any antihypertensive medication at this time.  ECG from 07/2014 reviewed.  HLD: His last LDL was 218, 11/2019.  He denies myalgias on Rosuvastatin and Ezetimibe.  He tries to consume a low-fat diet.  Flu: never Tetanus: unsure COVID: Moderna x 3 Shingrix: never PSA screening: 11/2019 Colon screening: 2013 Vision screening: annually Dentist: annually  Diet: He does eat meat. He consumes some fruits and veggies. He tries to avoid fried foods. He drinks mostly water, gatorade. Exercise: Walking  Review of Systems      Past Medical History:  Diagnosis Date  . Hyperlipidemia   . Hypertension     Current Outpatient Medications  Medication Sig Dispense Refill  . aspirin 81 MG tablet Take 81 mg by mouth daily.    Marland Kitchen ezetimibe (ZETIA) 10 MG tablet TAKE 1 TABLET(10 MG) BY MOUTH DAILY 90 tablet 0  . Multiple Vitamins-Minerals (MENS MULTI VITAMIN & MINERAL PO) Take 1 tablet by mouth daily.    . pantoprazole (PROTONIX) 40 MG tablet TAKE 1 TABLET(40 MG) BY MOUTH DAILY 90 tablet 0  . rosuvastatin (CRESTOR) 40 MG tablet TAKE 1 TABLET BY MOUTH EVERY DAY 90 tablet 0  . Zinc 50 MG TABS Take by mouth.     No current facility-administered medications for this visit.    No Known Allergies  Family History  Problem Relation Age of Onset  . Alzheimer's disease Mother   . Prostate cancer Father   . Hyperlipidemia Father   . Heart disease Father   . Alzheimer's disease Father     Social History   Socioeconomic History  . Marital status: Married    Spouse name: Not on file  . Number of children: Not on file  . Years of  education: Not on file  . Highest education level: Not on file  Occupational History  . Not on file  Tobacco Use  . Smoking status: Never Smoker  . Smokeless tobacco: Never Used  Substance and Sexual Activity  . Alcohol use: Yes    Alcohol/week: 0.0 standard drinks    Comment: up to 5 times weekly---beer or wine  . Drug use: No  . Sexual activity: Yes  Other Topics Concern  . Not on file  Social History Narrative  . Not on file   Social Determinants of Health   Financial Resource Strain: Not on file  Food Insecurity: Not on file  Transportation Needs: Not on file  Physical Activity: Not on file  Stress: Not on file  Social Connections: Not on file  Intimate Partner Violence: Not on file     Constitutional: Denies fever, malaise, fatigue, headache or abrupt weight changes.  HEENT: Denies eye pain, eye redness, ear pain, ringing in the ears, wax buildup, runny nose, nasal congestion, bloody nose, or sore throat. Respiratory: Denies difficulty breathing, shortness of breath, cough or sputum production.   Cardiovascular: Denies chest pain, chest tightness, palpitations or swelling in the hands or feet.  Gastrointestinal: Denies abdominal pain, bloating, constipation, diarrhea or blood in the stool.  GU: Denies urgency, frequency, pain with urination,  burning sensation, blood in urine, odor or discharge. Musculoskeletal: Denies decrease in range of motion, difficulty with gait, muscle pain or joint pain and swelling.  Skin: Denies redness, rashes, lesions or ulcercations.  Neurological: Denies dizziness, difficulty with memory, difficulty with speech or problems with balance and coordination.  Psych: Denies anxiety, depression, SI/HI.  No other specific complaints in a complete review of systems (except as listed in HPI above).  Objective:   Physical Exam  BP (!) 134/92   Pulse 62   Temp (!) 97.4 F (36.3 C) (Temporal)   Ht 5' 7" (1.702 m)   Wt 191 lb (86.6 kg)   SpO2  98%   BMI 29.91 kg/m   Wt Readings from Last 3 Encounters:  11/03/20 193 lb 8 oz (87.8 kg)  10/28/20 189 lb (85.7 kg)  06/25/19 192 lb (87.1 kg)    General: Appears his stated age, well developed, well nourished in NAD. Skin: Warm, dry and intact. No rashes, lesions or ulcerations noted. HEENT: Head: normal shape and size; Eyes: sclera white, no icterus, conjunctiva pink, PERRLA and EOMs intact;  Neck:  Neck supple, trachea midline. No masses, lumps or thyromegaly present.  Cardiovascular: Normal rate and rhythm. S1,S2 noted.  No murmur, rubs or gallops noted. No JVD or BLE edema. No carotid bruits noted. Pulmonary/Chest: Normal effort and positive vesicular breath sounds. No respiratory distress. No wheezes, rales or ronchi noted.  Abdomen: Soft and nontender. Normal bowel sounds. No distention or masses noted. Liver, spleen and kidneys non palpable. Musculoskeletal: Strength 5/5 BUE/BLE. No difficulty with gait.  Neurological: Alert and oriented. Cranial nerves II-XII grossly intact. Coordination normal.  Psychiatric: Mood and affect normal. Behavior is normal. Judgment and thought content normal.     BMET    Component Value Date/Time   NA 139 12/04/2019 1437   K 4.4 12/04/2019 1437   CL 102 12/04/2019 1437   CO2 25 12/04/2019 1437   GLUCOSE 95 12/04/2019 1437   BUN 12 12/04/2019 1437   CREATININE 0.87 12/04/2019 1437   CREATININE 0.88 10/15/2016 1653   CALCIUM 9.6 12/04/2019 1437    Lipid Panel     Component Value Date/Time   CHOL 295 (H) 12/04/2019 1437   TRIG 134.0 12/04/2019 1437   HDL 50.20 12/04/2019 1437   CHOLHDL 6 12/04/2019 1437   VLDL 26.8 12/04/2019 1437   LDLCALC 218 (H) 12/04/2019 1437    CBC    Component Value Date/Time   WBC 8.8 12/04/2019 1437   RBC 5.05 12/04/2019 1437   HGB 15.9 12/04/2019 1437   HCT 47.4 12/04/2019 1437   PLT 194.0 12/04/2019 1437   MCV 93.8 12/04/2019 1437   MCH 31.0 10/15/2016 1653   MCHC 33.6 12/04/2019 1437   RDW  14.6 12/04/2019 1437   LYMPHSABS 2,047 10/15/2016 1653   MONOABS 712 10/15/2016 1653   EOSABS 890 (H) 10/15/2016 1653   BASOSABS 0 10/15/2016 1653    Hgb A1C No results found for: HGBA1C          Assessment & Plan:   Preventative Health Maintenance:  He declines flu shot today He declines tetanus today Covid UTD He will let me know where he got his last colonoscopy so that it can be abstracted Encouraged him to consume a balanced diet and exercise regimen Advised him to see an eye doctor and dentist annually We will check CBC, c-Met, lipid profile and PSA today   RTC in 1 year, sooner if needed  Webb Silversmith, NP  This visit occurred during the SARS-CoV-2 public health emergency.  Safety protocols were in place, including screening questions prior to the visit, additional usage of staff PPE, and extensive cleaning of exam room while observing appropriate contact time as indicated for disinfecting solutions.

## 2020-12-24 LAB — COMPREHENSIVE METABOLIC PANEL
ALT: 57 U/L — ABNORMAL HIGH (ref 0–53)
AST: 34 U/L (ref 0–37)
Albumin: 4.4 g/dL (ref 3.5–5.2)
Alkaline Phosphatase: 60 U/L (ref 39–117)
BUN: 16 mg/dL (ref 6–23)
CO2: 28 mEq/L (ref 19–32)
Calcium: 9 mg/dL (ref 8.4–10.5)
Chloride: 103 mEq/L (ref 96–112)
Creatinine, Ser: 0.77 mg/dL (ref 0.40–1.50)
GFR: 94.46 mL/min (ref >60.00)
Glucose, Bld: 87 mg/dL (ref 70–99)
Potassium: 3.9 mEq/L (ref 3.5–5.1)
Sodium: 139 mEq/L (ref 135–145)
Total Bilirubin: 0.6 mg/dL (ref 0.2–1.2)
Total Protein: 6.7 g/dL (ref 6.0–8.3)

## 2020-12-24 LAB — CBC
HCT: 47.4 % (ref 39.0–52.0)
Hemoglobin: 16 g/dL (ref 13.0–17.0)
MCHC: 33.7 g/dL (ref 30.0–36.0)
MCV: 93.4 fl (ref 78.0–100.0)
Platelets: 191 10*3/uL (ref 150.0–400.0)
RBC: 5.07 Mil/uL (ref 4.22–5.81)
RDW: 13.8 % (ref 11.5–15.5)
WBC: 11.1 10*3/uL — ABNORMAL HIGH (ref 4.0–10.5)

## 2020-12-24 LAB — LIPID PANEL
Cholesterol: 172 mg/dL (ref 0–200)
HDL: 39.1 mg/dL (ref >39.00)
NonHDL: 132.87
Total CHOL/HDL Ratio: 4
Triglycerides: 297 mg/dL — ABNORMAL HIGH (ref 0.0–149.0)
VLDL: 59.4 mg/dL — ABNORMAL HIGH (ref 0.0–40.0)

## 2020-12-24 LAB — LDL CHOLESTEROL, DIRECT: Direct LDL: 114 mg/dL

## 2020-12-24 LAB — PSA: PSA: 0.81 ng/mL (ref 0.10–4.00)

## 2020-12-24 NOTE — Assessment & Plan Note (Signed)
No issues on Pantoprazole CBC and CMET today

## 2020-12-24 NOTE — Assessment & Plan Note (Signed)
Has been borderline for years without meds Reinforced DASH diet and exercise  Will monitor

## 2020-12-24 NOTE — Patient Instructions (Signed)
Caring for Your Mental Health Mental health is emotional, psychological, and social well-being. Mental health is just as important as physical health. In fact, mental and physical health are connected, and you need both to be healthy. Some signs of good mental health (well-being) include:  Being able to attend to tasks at home, school, or work.  Being able to manage stress and emotions.  Practicing self-care, which may include: ? A regular exercise pattern. ? A reasonably healthy diet. ? Supportive and trusting relationships. ? The ability to relax and calm yourself (self-calm).  Having pleasurable hobbies and activities to do.  Believing that you have meaning and purpose in your life.  Recovering and adjusting after facing challenges (resilience). You can take steps to build or strengthen these mentally healthy behaviors. There are resources and support to help you with this. Why is caring for mental health important? Caring for your mental health is a big part of staying healthy. Everyone has times when feelings, thoughts, or situations feel overwhelming. Mental health means having the skills to manage what feels overwhelming. If this sense of being overwhelmed persists, however, you might need some help. If you have some of the following signs, you may need to take better care of your mental health or seek help from a health care provider or mental health professional:  Problems with energy or focus.  Changes in eating habits.  Problems sleeping, such as sleeping too much or not enough.  Emotional distress, such as anger, sadness, depression, or anxiety.  Major changes in your relationships.  Losing interest in life or activities that you used to enjoy. If you have any of these symptoms on most days for 2 weeks or longer:  Talk with a close friend or family member about how you are feeling.  Contact your health care provider to discuss your symptoms.  Consider working with a  mental health professional. Your health care provider, family, or friends may be able to recommend a therapist. What can I do to promote emotional and mental health? Managing emotions  Learn to identify emotions and deal with them. Recognizing your emotions is the first step in learning to deal with them.  Practice ways to appropriately express feelings. Remember that you can control your feelings. They do not control you.  Practice stress management techniques, such as: ? Relaxation techniques, like breathing or muscle relaxation exercises. ? Exercise. Regular activity can lower your stress level. ? Changing what you can change and accepting what you cannot change.  Build up your resilience so that you can recover and adjust after big problems or challenges. Practice resilient behaviors and attitudes: ? Set and focus on long-term goals. ? Develop and maintain healthy, supportive relationships. ? Learn to accept change and make the best of the situation. ? Take care of yourself physically by eating a healthy diet, getting plenty of sleep, and exercising regularly. ? Develop self-awareness. Ask others to give feedback about how they see you. ? Practice mindfulness meditation to help you stay calm when dealing with daily challenges. ? Learn to respond to situations in healthy ways, rather than reacting with your emotions. ? Keep a positive attitude, and believe in yourself. Your view of yourself affects your mental health. ? Develop your listening and empathy skills. These will help you deal with difficult situations and communications.  Remember that emotions can be used as a good source of communication and are a great source of energy. Try to laugh and find humor in life.   Sleeping  Get the right amount and quality of sleep. Sleep has a big impact on physical and mental health. To improve your sleep: ? Go to bed and wake up around the same time every day. ? Limit screen time before  bedtime. This includes the use of your cell phone, TV, computer, and tablet. ? Keep your bedroom dark and cool. Activity  Exercise or do some physical activity regularly. This helps: ? Keep your body strong, especially during times of stress. ? Get rid of chemicals in your body (hormones) that build up when you are stressed. ? Build up your resilience.   Eating and drinking  Eat a healthy diet that includes whole grains, vegetables, fresh fruits, and lean proteins. If you have questions about what foods are best for you, ask your health care provider.  Try not to turn to sweet, salty, or otherwise unhealthy foods when you are tired or unhappy. This can lead to unwanted weight gain and is not a healthy way to cope with emotions.   Where to find more information You can find more information about how to care for your mental health from:  National Alliance on Mental Illness (NAMI): www.nami.org  National Institute of Mental Health: www.nimh.nih.gov  Centers for Disease Control and Prevention: www.cdc.gov/hrqol/wellbeing.htm Contact a health care provider if:  You lose interest in being with others or you do not want to leave the house.  You have a hard time completing your normal activities or you have less energy than normal.  You cannot stay focused or you have problems with memory.  You feel that your senses are heightened, and this makes you upset or concerned.  You feel nervous or have rapid mood changes.  You are sleeping or eating more or less than normal.  You question reality or you show odd behavior that disturbs you or others. Get help right away if:  You have thoughts about hurting yourself or others. If you ever feel like you may hurt yourself or others, or have thoughts about taking your own life, get help right away. You can go to your nearest emergency department or call:  Your local emergency services (911 in the [place]  A suicide crisis helpline, such as the  National Suicide Prevention Lifeline at 1-800-273-8255. This is open 24 hours a day. Summary  Mental health is not just the absence of mental illness. It involves understanding your emotions and behaviors, and taking steps to cope with them in a healthy way.  If you have symptoms of mental or emotional distress, get help from family, friends, a health care provider, or a mental health professional.  Practice good mental health behaviors such as stress management skills, self-calming skills, exercise, and healthy sleeping and eating. This information is not intended to replace advice given to you by your health care provider. Make sure you discuss any questions you have with your health care provider. Document Revised: 05/22/2020 Document Reviewed: 05/22/2020 Elsevier Patient Education  2021 Elsevier Inc.  

## 2020-12-24 NOTE — Assessment & Plan Note (Signed)
CMET and lipid profile today Continue Rosuvastatin and Ezetimibe Encouraged him to consume a low fat diet

## 2020-12-26 ENCOUNTER — Encounter: Payer: Self-pay | Admitting: Internal Medicine

## 2021-01-24 ENCOUNTER — Other Ambulatory Visit: Payer: Self-pay | Admitting: Internal Medicine

## 2021-04-24 ENCOUNTER — Other Ambulatory Visit: Payer: Self-pay | Admitting: Internal Medicine

## 2021-04-30 ENCOUNTER — Telehealth: Payer: Self-pay

## 2021-04-30 NOTE — Telephone Encounter (Signed)
Moving to [place] 

## 2021-07-23 ENCOUNTER — Other Ambulatory Visit: Payer: Self-pay | Admitting: Internal Medicine

## 2021-07-23 NOTE — Telephone Encounter (Signed)
Last office visit 12/23/20 No upcoming appointment Zetia last refill 04/30/21 #90 Crestor last refill 04/30/21 #90

## 2021-09-18 DIAGNOSIS — M25561 Pain in right knee: Secondary | ICD-10-CM | POA: Diagnosis not present

## 2021-10-21 ENCOUNTER — Other Ambulatory Visit: Payer: Self-pay | Admitting: Family

## 2021-10-21 ENCOUNTER — Other Ambulatory Visit: Payer: Self-pay | Admitting: Internal Medicine

## 2021-10-21 NOTE — Telephone Encounter (Signed)
Requested medication (s) are due for refill today:   Yes  Requested medication (s) are on the active medication list:   Yes  Future visit scheduled:   No.  Pt of Nicki Reaper that is continuing with her at [employer] per note in chart.   Last ordered: 01/27/2021 #90, 2 refills  Returned for [facility] to review for refills.  No protocol information for this medication.   Requested Prescriptions  Pending Prescriptions Disp Refills   pantoprazole (PROTONIX) 40 MG tablet [Pharmacy Med Name: PANTOPRAZOLE 40MG  TABLETS] 90 tablet 2    Sig: TAKE 1 TABLET(40 MG) BY MOUTH DAILY     There is no refill protocol information for this order

## 2021-10-30 DIAGNOSIS — M25561 Pain in right knee: Secondary | ICD-10-CM | POA: Diagnosis not present

## 2022-01-19 ENCOUNTER — Observation Stay: Payer: BC Managed Care – PPO

## 2022-01-19 ENCOUNTER — Other Ambulatory Visit: Payer: Self-pay

## 2022-01-19 ENCOUNTER — Emergency Department: Payer: BC Managed Care – PPO

## 2022-01-19 ENCOUNTER — Observation Stay
Admission: EM | Admit: 2022-01-19 | Discharge: 2022-01-20 | Disposition: A | Discharge status: Home / Self Care | Payer: BC Managed Care – PPO | Attending: Internal Medicine | Admitting: Internal Medicine

## 2022-01-19 ENCOUNTER — Other Ambulatory Visit: Payer: Self-pay | Admitting: Internal Medicine

## 2022-01-19 DIAGNOSIS — F1020 Alcohol dependence, uncomplicated: Secondary | ICD-10-CM | POA: Diagnosis not present

## 2022-01-19 DIAGNOSIS — R410 Disorientation, unspecified: Secondary | ICD-10-CM

## 2022-01-19 DIAGNOSIS — E7800 Pure hypercholesterolemia, unspecified: Secondary | ICD-10-CM | POA: Diagnosis not present

## 2022-01-19 DIAGNOSIS — R4182 Altered mental status, unspecified: Secondary | ICD-10-CM | POA: Diagnosis not present

## 2022-01-19 DIAGNOSIS — Z7982 Long term (current) use of aspirin: Secondary | ICD-10-CM | POA: Insufficient documentation

## 2022-01-19 DIAGNOSIS — G319 Degenerative disease of nervous system, unspecified: Secondary | ICD-10-CM | POA: Diagnosis not present

## 2022-01-19 DIAGNOSIS — E785 Hyperlipidemia, unspecified: Secondary | ICD-10-CM | POA: Diagnosis present

## 2022-01-19 DIAGNOSIS — Z79899 Other long term (current) drug therapy: Secondary | ICD-10-CM | POA: Insufficient documentation

## 2022-01-19 DIAGNOSIS — I10 Essential (primary) hypertension: Secondary | ICD-10-CM | POA: Diagnosis not present

## 2022-01-19 DIAGNOSIS — J3489 Other specified disorders of nose and nasal sinuses: Secondary | ICD-10-CM | POA: Diagnosis not present

## 2022-01-19 DIAGNOSIS — I2510 Atherosclerotic heart disease of native coronary artery without angina pectoris: Secondary | ICD-10-CM | POA: Insufficient documentation

## 2022-01-19 DIAGNOSIS — Z955 Presence of coronary angioplasty implant and graft: Secondary | ICD-10-CM | POA: Insufficient documentation

## 2022-01-19 DIAGNOSIS — I161 Hypertensive emergency: Secondary | ICD-10-CM | POA: Insufficient documentation

## 2022-01-19 DIAGNOSIS — Z20822 Contact with and (suspected) exposure to covid-19: Secondary | ICD-10-CM | POA: Insufficient documentation

## 2022-01-19 LAB — COMPREHENSIVE METABOLIC PANEL
ALT: 25 U/L (ref 0–44)
AST: 24 U/L (ref 15–41)
Albumin: 4.7 g/dL (ref 3.5–5.0)
Alkaline Phosphatase: 61 U/L (ref 38–126)
Anion gap: 8 (ref 5–15)
BUN: 15 mg/dL (ref 8–23)
CO2: 26 mmol/L (ref 22–32)
Calcium: 9.7 mg/dL (ref 8.9–10.3)
Chloride: 105 mmol/L (ref 98–111)
Creatinine, Ser: 0.9 mg/dL (ref 0.61–1.24)
GFR, Estimated: >60 mL/min (ref >60)
Glucose, Bld: 110 mg/dL — ABNORMAL HIGH (ref 70–99)
Potassium: 3.8 mmol/L (ref 3.5–5.1)
Sodium: 139 mmol/L (ref 135–145)
Total Bilirubin: 1.2 mg/dL (ref 0.3–1.2)
Total Protein: 8.1 g/dL (ref 6.5–8.1)

## 2022-01-19 LAB — URINE DRUG SCREEN, QUALITATIVE (ARMC ONLY)
Amphetamines, Ur Screen: NOT DETECTED
Barbiturates, Ur Screen: NOT DETECTED
Benzodiazepine, Ur Scrn: NOT DETECTED
Cannabinoid 50 Ng, Ur ~~LOC~~: NOT DETECTED
Cocaine Metabolite,Ur ~~LOC~~: NOT DETECTED
MDMA (Ecstasy)Ur Screen: NOT DETECTED
Methadone Scn, Ur: NOT DETECTED
Opiate, Ur Screen: NOT DETECTED
Phencyclidine (PCP) Ur S: NOT DETECTED
Tricyclic, Ur Screen: NOT DETECTED

## 2022-01-19 LAB — URINALYSIS, COMPLETE (UACMP) WITH MICROSCOPIC
Bacteria, UA: NONE SEEN
Bilirubin Urine: NEGATIVE
Glucose, UA: NEGATIVE mg/dL
Hgb urine dipstick: NEGATIVE
Ketones, ur: NEGATIVE mg/dL
Leukocytes,Ua: NEGATIVE
Nitrite: NEGATIVE
Protein, ur: NEGATIVE mg/dL
Specific Gravity, Urine: 1.006 (ref 1.005–1.030)
Squamous Epithelial / HPF: NONE SEEN (ref 0–5)
pH: 5 (ref 5.0–8.0)

## 2022-01-19 LAB — CBC
HCT: 50.3 % (ref 39.0–52.0)
Hemoglobin: 16.9 g/dL (ref 13.0–17.0)
MCH: 31.4 pg (ref 26.0–34.0)
MCHC: 33.6 g/dL (ref 30.0–36.0)
MCV: 93.3 fL (ref 80.0–100.0)
Platelets: 203 10*3/uL (ref 150–400)
RBC: 5.39 MIL/uL (ref 4.22–5.81)
RDW: 13.2 % (ref 11.5–15.5)
WBC: 10.6 10*3/uL — ABNORMAL HIGH (ref 4.0–10.5)
nRBC: 0 % (ref 0.0–0.2)

## 2022-01-19 LAB — RESP PANEL BY RT-PCR (FLU A&B, COVID) ARPGX2
Influenza A by PCR: NEGATIVE
Influenza B by PCR: NEGATIVE
SARS Coronavirus 2 by RT PCR: NEGATIVE

## 2022-01-19 LAB — AMMONIA: Ammonia: 23 umol/L (ref 9–35)

## 2022-01-19 LAB — TROPONIN I (HIGH SENSITIVITY): Troponin I (High Sensitivity): 6 ng/L (ref <18)

## 2022-01-19 MED ORDER — SODIUM CHLORIDE 0.9% FLUSH: 3.0000 mL | INTRAVENOUS | Status: DC | PRN

## 2022-01-19 MED ORDER — EZETIMIBE 10 MG PO TABS
10.0000 mg | ORAL_TABLET | Freq: Every day | ORAL | Status: DC
  Administered 2022-01-20: 10 mg via ORAL
  Filled 2022-01-19: qty 1

## 2022-01-19 MED ORDER — SODIUM CHLORIDE 0.9% FLUSH: 3.0000 mL | Freq: Two times a day (BID) | INTRAVENOUS | Status: DC

## 2022-01-19 MED ORDER — LORAZEPAM 1 MG PO TABS: 1.0000 mg | ORAL_TABLET | ORAL | Status: DC | PRN

## 2022-01-19 MED ORDER — LORAZEPAM 2 MG PO TABS: 0.0000 mg | ORAL_TABLET | Freq: Two times a day (BID) | ORAL | Status: DC

## 2022-01-19 MED ORDER — AMLODIPINE BESYLATE 5 MG PO TABS
5.0000 mg | ORAL_TABLET | Freq: Every day | ORAL | Status: DC
  Administered 2022-01-19: 5 mg via ORAL
  Filled 2022-01-19: qty 1

## 2022-01-19 MED ORDER — ONDANSETRON HCL 4 MG/2ML IJ SOLN: 4.0000 mg | Freq: Four times a day (QID) | INTRAMUSCULAR | Status: DC | PRN

## 2022-01-19 MED ORDER — LORAZEPAM 2 MG PO TABS: 0.0000 mg | ORAL_TABLET | Freq: Four times a day (QID) | ORAL | Status: DC

## 2022-01-19 MED ORDER — ONDANSETRON HCL 4 MG PO TABS: 4.0000 mg | ORAL_TABLET | Freq: Four times a day (QID) | ORAL | Status: DC | PRN

## 2022-01-19 MED ORDER — SODIUM CHLORIDE 0.9 % IV SOLN: 250.0000 mL | INTRAVENOUS | Status: DC | PRN

## 2022-01-19 MED ORDER — LABETALOL HCL 5 MG/ML IV SOLN
10.0000 mg | Freq: Once | INTRAVENOUS | Status: AC
  Administered 2022-01-19: 10 mg via INTRAVENOUS
  Filled 2022-01-19: qty 4

## 2022-01-19 MED ORDER — FOLIC ACID 1 MG PO TABS
1.0000 mg | ORAL_TABLET | Freq: Every day | ORAL | Status: DC
  Administered 2022-01-20: 1 mg via ORAL
  Filled 2022-01-19: qty 1

## 2022-01-19 MED ORDER — ROSUVASTATIN CALCIUM 20 MG PO TABS
40.0000 mg | ORAL_TABLET | Freq: Every day | ORAL | Status: DC
  Administered 2022-01-20: 40 mg via ORAL
  Filled 2022-01-19: qty 2

## 2022-01-19 MED ORDER — ACETAMINOPHEN 325 MG PO TABS: 650.0000 mg | ORAL_TABLET | Freq: Four times a day (QID) | ORAL | Status: DC | PRN

## 2022-01-19 MED ORDER — PANTOPRAZOLE SODIUM 40 MG PO TBEC
40.0000 mg | DELAYED_RELEASE_TABLET | Freq: Every day | ORAL | Status: DC
  Administered 2022-01-20: 40 mg via ORAL
  Filled 2022-01-19: qty 1

## 2022-01-19 MED ORDER — ADULT MULTIVITAMIN W/MINERALS CH
1.0000 | ORAL_TABLET | Freq: Every day | ORAL | Status: DC
  Administered 2022-01-20: 1 via ORAL
  Filled 2022-01-19: qty 1

## 2022-01-19 MED ORDER — SODIUM CHLORIDE 0.9% FLUSH
3.0000 mL | Freq: Two times a day (BID) | INTRAVENOUS | Status: DC
  Administered 2022-01-19: 3 mL via INTRAVENOUS

## 2022-01-19 MED ORDER — THIAMINE HCL 100 MG/ML IJ SOLN: 100.0000 mg | Freq: Every day | INTRAMUSCULAR | Status: DC

## 2022-01-19 MED ORDER — LORAZEPAM 2 MG/ML IJ SOLN: 1.0000 mg | INTRAMUSCULAR | Status: DC | PRN

## 2022-01-19 MED ORDER — THIAMINE HCL 100 MG PO TABS
100.0000 mg | ORAL_TABLET | Freq: Every day | ORAL | Status: DC
  Administered 2022-01-20: 100 mg via ORAL
  Filled 2022-01-19 (×2): qty 1

## 2022-01-19 MED ORDER — ASPIRIN EC 81 MG PO TBEC
81.0000 mg | DELAYED_RELEASE_TABLET | Freq: Every day | ORAL | Status: DC
  Administered 2022-01-20: 81 mg via ORAL
  Filled 2022-01-19: qty 1

## 2022-01-19 MED ORDER — ACETAMINOPHEN 650 MG RE SUPP: 650.0000 mg | Freq: Four times a day (QID) | RECTAL | Status: DC | PRN

## 2022-01-19 NOTE — ED Notes (Signed)
Patient to CT.

## 2022-01-19 NOTE — ED Notes (Signed)
Pt repeating sentences in triage.

## 2022-01-19 NOTE — H&P (Signed)
History and Physical    Patient: Thomas Farley [id number] DOB: Jun 02, 1956 DOA: 01/19/2022 DOS: the patient was seen and examined on 01/19/2022 PCP: Jearld Fenton, NP  Patient coming from: Home  Chief Complaint:  Chief Complaint  Patient presents with   Altered Mental Status    HPI: Thomas Farley is a 66 y.o. male with medical history significant hypertension not on medication, coronary artery disease status post stent angioplasty, dyslipidemia and daily ETOH use who presents to the ER via private vehicle for evaluation of confusion which started this morning while at work. 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 He notes that this morning he was very slow with most of his tasks and was also very repetitive.  He just felt off and still left his job at about 1 PM and went home and was advised to go to the ER to get evaluated by his brother. He denies having any headache, he has no focal deficits, no tingling, no numbness, no difficulty swallowing, no chest pain, no blurred vision, no diaphoresis, no palpitations, no double vision. Upon arrival to the ER he was noted to have significantly elevated blood pressure of 216/120. He received 2 doses of IV labetalol and at the time of my evaluation his blood pressure is 170/90  Review of Systems: As mentioned in the history of present illness. All other systems reviewed and are negative. Past Medical History:  Diagnosis Date   Hyperlipidemia    Hypertension    Past Surgical History:  Procedure Laterality Date   CAROTID STENT INSERTION  2000   MANDIBLE RECONSTRUCTION  1975   Social History:  reports that he has never smoked. He has never used smokeless tobacco. He reports current alcohol use. He reports that he does not use drugs.  No Known Allergies  Family History  Problem Relation Age of Onset   Alzheimer's disease  Mother    Prostate cancer Father    Hyperlipidemia Father    Heart disease Father    Alzheimer's disease Father     Prior to Admission medications   Medication Sig Start Date End Date Taking? Authorizing Provider  aspirin 81 MG tablet Take 81 mg by mouth daily.    [provider]  ezetimibe (ZETIA) 10 MG tablet TAKE 1 TABLET(10 MG) BY MOUTH DAILY 10/21/21   Jearld Fenton, NP  Multiple Vitamins-Minerals (MENS MULTI VITAMIN & MINERAL PO) Take 1 tablet by mouth daily.    [provider]  pantoprazole (PROTONIX) 40 MG tablet TAKE 1 TABLET(40 MG) BY MOUTH DAILY 10/22/21   Jearld Fenton, NP  rosuvastatin (CRESTOR) 40 MG tablet TAKE 1 TABLET BY MOUTH EVERY DAY 10/21/21   Jearld Fenton, NP  Zinc 50 MG TABS Take by mouth.    [provider]    Physical Exam: Vitals:   01/19/22 1415 01/19/22 1437 01/19/22 1500 01/19/22 1536  BP: (!) 213/127 (!) 216/100 (!) 188/113 (!) 177/99  Pulse: 81 83 71 70  Resp: 13 10 14  (!) 22  Temp:      TempSrc:      SpO2: 100% 100% 98% 98%  Weight:      Height:       Physical Exam Vitals and nursing note reviewed.  Constitutional:      Appearance: Normal appearance. He is normal weight.  HENT:     Head: Normocephalic and atraumatic.  Nose: Nose normal.     Mouth/Throat:     Mouth: Mucous membranes are moist.  Eyes:     Pupils: Pupils are equal, round, and reactive to light.  Cardiovascular:     Rate and Rhythm: Normal rate and regular rhythm.  Pulmonary:     Effort: Pulmonary effort is normal.  Abdominal:     General: Abdomen is flat. Bowel sounds are normal.     Palpations: Abdomen is soft.  Musculoskeletal:        General: Normal range of motion.     Cervical back: Normal range of motion and neck supple.  Skin:    General: Skin is warm and dry.  Neurological:     General: No focal deficit present.     Mental Status: He is alert and oriented to person, place, and time.     Comments: Able to move all extremities   Psychiatric:        Mood and Affect: Mood normal.        Behavior: Behavior normal.     Data Reviewed: Notes from primary care and specialist visits, past discharge summaries. Prior diagnostic testing as applicable to current admission diagnoses Updated medications and problem lists for reconciliation ED course, including vitals, labs, imaging, treatment and response to treatment Triage notes and ED providers notes  There are no new results to review at this time.  Assessment and Plan: Principal Problem:   AMS (altered mental status) Active Problems:   HLD (hyperlipidemia)   Essential hypertension   EtOH dependence (HCC)     Altered mental status Rule out hypertensive encephalopathy vs TIA vs CVA Patient noted to have significantly elevated blood pressure upon arrival to the ER associated with confusion without any headaches or seizure. Received 2 doses of labetalol in the ER with mild improvement in his blood pressure Initial CT scan of the head is negative for hemorrhage We will obtain MRI of the brain without contrast to rule out an acute stroke We will allow for permissive hypertension until an acute stroke is ruled out     Dyslipidemia Continue statins and Zetia    History of alcohol dependence Denies having any history of alcohol withdrawal when he does not drink We will place on CIWA protocol and administer lorazepam for CIWA score of 8 or greater    Advance Care Planning:   Code Status: Not on file Full code  Consults: None  Family Communication: Greater than 50% of time was spent discussing patient's condition and plan of care with him at the bedside.  All questions and concerns have been addressed.  He verbalizes understanding and agrees to the plan.  Severity of Illness: The appropriate patient status for this patient is OBSERVATION. Observation status is judged to be reasonable and necessary in order to provide the required intensity of service to  ensure the patient's safety. The patient's presenting symptoms, physical exam findings, and initial radiographic and laboratory data in the context of their medical condition is felt to place them at decreased risk for further clinical deterioration. Furthermore, it is anticipated that the patient will be medically stable for discharge from the hospital within 2 midnights of admission.   Author: Collier Bullock, MD 01/19/2022 4:02 PM  For on call review [email or web address].

## 2022-01-19 NOTE — Telephone Encounter (Signed)
Requested medication (s) are due for refill today:   Yes for all 3  Requested medication (s) are on the active medication list:   Yes for all 3  Future visit scheduled:   No   Last seen by Rene Kocher at [facility] 12/23/2020 but has changed to [facility].     Last ordered: Zetia 10/21/2021 #90, 0 refill;  rosuvastatin 10/21/2021 #90, 0 refill;    Protonix 10/22/2021 #90, 0 refills  Returned because wasn't sure if Rene Kocher wanted to see him before refills.   I noticed he is in the ED now for altered mental status so wasn't able to call and schedule him an appt.   Requested Prescriptions  Pending Prescriptions Disp Refills   ezetimibe (ZETIA) 10 MG tablet [Pharmacy Med Name: EZETIMIBE 10MG  TABLETS] 90 tablet 0    Sig: TAKE 1 TABLET(10 MG) BY MOUTH DAILY     There is no refill protocol information for this order     rosuvastatin (CRESTOR) 40 MG tablet [Pharmacy Med Name: ROSUVASTATIN 40MG  TABLETS] 90 tablet 0    Sig: TAKE 1 TABLET BY MOUTH EVERY DAY     There is no refill protocol information for this order     pantoprazole (PROTONIX) 40 MG tablet [Pharmacy Med Name: PANTOPRAZOLE 40MG  TABLETS] 90 tablet 0    Sig: TAKE 1 TABLET(40 MG) BY MOUTH DAILY     There is no refill protocol information for this order

## 2022-01-19 NOTE — ED Triage Notes (Signed)
Pt to ED with wife for confusion that started at 0800 this am. Pt states he was at work and just kept getting confused. Denies weakness, denies trouble thinking of words, denies falls. clear speech. Denies burning with urination.  Pt hesitated answering orientation questions.  Pt has chronic right lazy eye.

## 2022-01-19 NOTE — ED Provider Notes (Signed)
[facility] Provider Note    Event Date/Time   First MD Initiated Contact with Patient 01/19/22 1402     (approximate)   History   Altered Mental Status   HPI  Thomas Farley is a 66 y.o. male has medical history of hypertension not on meds, hyperlipidemia and chronic alcohol use who presents with change in cognition.  Patient tells me that he was while he was at work today he felt like he was not able to think straight.  Describes forgetting things and not remembering what was real and what was not.  When asked to give an example he struggles and just keeps repeating that cognitively he was not right.  Dose of the knee was not able to remember his primary care providers name.  Since coming emergency department he feels like he is improving significantly.  His wife was with him does not notice any difference.  He denies visual change headache numbness tingling weakness nausea vomiting chest pain shortness of breath back pain.  Patient has been told he had high blood pressure in the past but never been prescribed medication.  Drinks out 5-6 nights per week.  Last drink was last night.    Past Medical History:  Diagnosis Date   Hyperlipidemia    Hypertension     Patient Active Problem List   Diagnosis Date Noted   Esophageal reflux 08/21/2015   HLD (hyperlipidemia) 08/21/2015   Essential hypertension 08/21/2015     Physical Exam  Triage Vital Signs: ED Triage Vitals  Enc Vitals Group     BP 01/19/22 1401 (!) 204/117     Pulse Rate 01/19/22 1401 79     Resp 01/19/22 1401 20     Temp 01/19/22 1401 98.6 F (37 C)     Temp Source 01/19/22 1401 Oral     SpO2 01/19/22 1401 97 %     Weight 01/19/22 1402 190 lb (86.2 kg)     Height 01/19/22 1402 5\' 7"  (1.702 m)     Head Circumference --      Peak Flow --      Pain Score 01/19/22 1402 0     Pain Loc --      Pain Edu? --      Excl. in GC? --     Most recent vital signs: Vitals:   01/19/22 1437  01/19/22 1500  BP: (!) 216/100 (!) 188/113  Pulse: 83 71  Resp: 10 14  Temp:    SpO2: 100% 98%     General: Awake, no distress.  CV:  Good peripheral perfusion.  Resp:  Normal effort.  Abd:  No distention.  Neuro:             Awake, Alert, Oriented x 3  Other:  Aox3, nml speech  PERRL, EOMI, nml tongue movement  R ptosis, chronic 5/5 strength in the BL upper and lower extremities  Sensation grossly intact in the BL upper and lower extremities  Finger-nose-finger intact BL    ED Results / Procedures / Treatments  Labs (all labs ordered are listed, but only abnormal results are displayed) Labs Reviewed  CBC - Abnormal; Notable for the following components:      Result Value   WBC 10.6 (*)    All other components within normal limits  COMPREHENSIVE METABOLIC PANEL - Abnormal; Notable for the following components:   Glucose, Bld 110 (*)    All other components within normal limits  RESP PANEL  BY RT-PCR (FLU A&B, COVID) ARPGX2  URINALYSIS, COMPLETE (UACMP) WITH MICROSCOPIC  AMMONIA  URINE DRUG SCREEN, QUALITATIVE ([facility] ONLY)  TROPONIN I (HIGH SENSITIVITY)     EKG  EKG interpretation performed by myself: NSR, nml axis, nml intervals, no acute ischemic changes    RADIOLOGY  I reviewed the CT scan of the brain which does not show any acute intracranial process; agree with radiology report     PROCEDURES:  Critical Care performed: Yes, see critical care procedure note(s)  .1-3 Lead EKG Interpretation Performed by: Georga Hacking, MD Authorized by: Georga Hacking, MD     Interpretation: normal     ECG rate assessment: normal     Ectopy: none     Conduction: normal   .Critical Care Performed by: Georga Hacking, MD Authorized by: Georga Hacking, MD   Critical care provider statement:    Critical care time (minutes):  30   Critical care was time spent personally by me on the following activities:  Development of treatment plan with patient or  surrogate, discussions with consultants, evaluation of patient's response to treatment, examination of patient, ordering and review of laboratory studies, ordering and review of radiographic studies, ordering and performing treatments and interventions, pulse oximetry, re-evaluation of patient's condition and review of old charts  The patient is on the cardiac monitor to evaluate for evidence of arrhythmia and/or significant heart rate changes.   MEDICATIONS ORDERED IN ED: Medications  labetalol (NORMODYNE) injection 10 mg (has no administration in time range)  labetalol (NORMODYNE) injection 10 mg (10 mg Intravenous Given 01/19/22 1458)     IMPRESSION / MDM / ASSESSMENT AND PLAN / ED COURSE  I reviewed the triage vital signs and the nursing notes.                              Differential diagnosis includes, but is not limited to, CVA, hypertensive urgency/emergency, abnormality, intoxication  Patient is a 66 year old male with previously diagnosed borderline hypertension but not on any meds who presents with change in mental status.  Patient describes subtle cognitive changes that he noticed while he was working today.  Overall feels somewhat improved but his wife does note that he is repeating himself.  Has somewhat difficulty describing exactly what he is feeling but gives the example of not being able to remember his primary doctor's name and having difficulty understanding or remembering what is real and what is not.  Patient has no headache visual change numbness weakness nausea vomiting.  On exam other than right-sided ptosis which is chronic for him he has no focal deficits.  He is alert and oriented x3 although does take a second to respond to each question.  Patient however does repeat himself several times.  CT head does not have any acute abnormality.  He is quite hypertensive, considering the diagnosis of hypertensive encephalopathy was given 10 mg of labetalol, blood pressure improved  to 180s over 1 teens.  We will give another dose with goal of lowering by 25% first 24 hours.  On reassessment patient generally feeling well but he tells me a story of when he visited his son and has not slept the night before and had some confusion.  2 minutes later he tells me the exact same story again.  Given his change in his mental status with his blood pressure being so elevated I do feel that he needs admission likely MRI  and blood pressure control.  Discussed with the hospitalist for admission.        FINAL CLINICAL IMPRESSION(S) / ED DIAGNOSES   Final diagnoses:  Hypertensive emergency  Confusion     Rx / DC Orders   ED Discharge Orders     None        Note:  This document was prepared using Dragon voice recognition software and may include unintentional dictation errors.   Georga Hacking, MD 01/19/22 [phone]

## 2022-01-20 DIAGNOSIS — I10 Essential (primary) hypertension: Secondary | ICD-10-CM | POA: Diagnosis not present

## 2022-01-20 DIAGNOSIS — R4182 Altered mental status, unspecified: Secondary | ICD-10-CM | POA: Diagnosis not present

## 2022-01-20 LAB — BASIC METABOLIC PANEL
Anion gap: 5 (ref 5–15)
BUN: 18 mg/dL (ref 8–23)
CO2: 28 mmol/L (ref 22–32)
Calcium: 9.4 mg/dL (ref 8.9–10.3)
Chloride: 106 mmol/L (ref 98–111)
Creatinine, Ser: 0.72 mg/dL (ref 0.61–1.24)
GFR, Estimated: >60 mL/min (ref >60)
Glucose, Bld: 111 mg/dL — ABNORMAL HIGH (ref 70–99)
Potassium: 4 mmol/L (ref 3.5–5.1)
Sodium: 139 mmol/L (ref 135–145)

## 2022-01-20 LAB — CBC
HCT: 47 % (ref 39.0–52.0)
Hemoglobin: 15.7 g/dL (ref 13.0–17.0)
MCH: 30.7 pg (ref 26.0–34.0)
MCHC: 33.4 g/dL (ref 30.0–36.0)
MCV: 91.8 fL (ref 80.0–100.0)
Platelets: 178 10*3/uL (ref 150–400)
RBC: 5.12 MIL/uL (ref 4.22–5.81)
RDW: 13.4 % (ref 11.5–15.5)
WBC: 8.3 10*3/uL (ref 4.0–10.5)
nRBC: 0 % (ref 0.0–0.2)

## 2022-01-20 LAB — HIV ANTIBODY (ROUTINE TESTING W REFLEX): HIV Screen 4th Generation wRfx: NONREACTIVE

## 2022-01-20 MED ORDER — THIAMINE HCL 100 MG PO TABS: 100.0000 mg | ORAL_TABLET | Freq: Every day | ORAL | 1 refills | Status: DC

## 2022-01-20 MED ORDER — AMLODIPINE BESYLATE 10 MG PO TABS: 10.0000 mg | ORAL_TABLET | Freq: Every day | ORAL | 1 refills | Status: DC

## 2022-01-20 MED ORDER — AMLODIPINE BESYLATE 10 MG PO TABS
10.0000 mg | ORAL_TABLET | Freq: Every day | ORAL | Status: DC
  Administered 2022-01-20: 10 mg via ORAL
  Filled 2022-01-20: qty 1

## 2022-01-20 MED ORDER — FOLIC ACID 1 MG PO TABS: 1.0000 mg | ORAL_TABLET | Freq: Every day | ORAL | 1 refills | Status: DC

## 2022-01-20 NOTE — Progress Notes (Signed)
°  Transition of Care [facility]) Screening Note   Patient Details  Name: Faysal Fenoglio Date of Birth: 09/30/1956   Transition of Care [facility]) CM/SW Contact:    Gildardo Griffes, LCSW Phone Number: 01/20/2022, 10:46 AM  CSW was consulted for substance abuse resources, patient seen at bedside. Reports he declined substance abuse resources at this time and has no needs.   Transition of Care Department [facility]) has reviewed patient and no TOC needs have been identified at this time. We will continue to monitor patient advancement through interdisciplinary progression rounds. If new patient transition needs arise, please place a TOC consult.  [place], [place] [phone]

## 2022-01-20 NOTE — Discharge Summary (Signed)
Physician Discharge Summary  Thomas Farley GEX:[id number] DOB: January 24, 1956 DOA: 01/19/2022  PCP: Thomas Farley, Thomas W, NP  Admit date: 01/19/2022 Discharge date: 01/20/2022  Admitted From: Home Disposition: Home  Recommendations for Outpatient Follow-up:  Follow up with PCP in 1-2 weeks Please obtain BMP/CBC in one week Please follow up on the following pending results: None  Home Health: No Equipment/Devices: None Discharge Condition: Stable CODE STATUS: Full Diet recommendation: Heart Healthy   Brief/Interim Summary: Thomas Farley is a 66 y.o. male with medical history significant hypertension not on medication, coronary artery disease status post stent angioplasty, dyslipidemia and daily ETOH use who presents to the ER via private vehicle for evaluation of confusion which started this morning while at work.  Patient was feeling little off during work but was able to finish his time.  No headaches or focal deficit.  No chest pain or shortness of breath. He was found to have markedly elevated blood pressure at 216/120, received 2 doses of IV labetalol with improvement to 170/90.  He was started on amlodipine, received 5 mg on 01/19/2022 and dose was increased to 10 mg on 01/20/2022. Mild change in mental status was thought to be due to hypertensive encephalopathy, imaging was negative for any CVA. He was advised to have a close follow-up with PCP for further management of his blood pressure.   Discharge Diagnoses:  Principal Problem:   AMS (altered mental status) Active Problems:   HLD (hyperlipidemia)   Essential hypertension   EtOH dependence (HCC)   Discharge Instructions  Discharge Instructions     Diet - low sodium heart healthy   Complete by: As directed    Discharge instructions   Complete by: As directed    It was pleasure taking care of you. It is important that you follow-up with your primary care doctor very closely for better control of your blood pressure. You  are being started on a new medicine for your blood pressure, keep checking your blood pressure at home while you are relaxed and resting and keep a log, take that log to your primary care doctor for further recommendations. Keep yourself well-hydrated.   Increase activity slowly   Complete by: As directed    Increase activity slowly   Complete by: As directed       Allergies as of 01/20/2022   No Known Allergies      Medication List     TAKE these medications    amLODipine 10 MG tablet Commonly known as: NORVASC Take 1 tablet (10 mg total) by mouth daily. Start taking on: January 21, 2022   aspirin 81 MG tablet Take 81 mg by mouth daily.   ezetimibe 10 MG tablet Commonly known as: ZETIA TAKE 1 TABLET(10 MG) BY MOUTH DAILY   folic acid 1 MG tablet Commonly known as: FOLVITE Take 1 tablet (1 mg total) by mouth daily.   meloxicam 15 MG tablet Commonly known as: MOBIC Take 15 mg by mouth daily.   MENS MULTI VITAMIN & MINERAL PO Take 1 tablet by mouth daily.   pantoprazole 40 MG tablet Commonly known as: PROTONIX TAKE 1 TABLET(40 MG) BY MOUTH DAILY   rosuvastatin 40 MG tablet Commonly known as: CRESTOR TAKE 1 TABLET BY MOUTH EVERY DAY   thiamine 100 MG tablet Take 1 tablet (100 mg total) by mouth daily.   Zinc 50 MG Tabs Take by mouth.        No Known Allergies  Consultations: None  Procedures/Studies: CT  HEAD WO CONTRAST ( )  Result Date: 01/19/2022 CLINICAL DATA:  Altered mental status. EXAM: CT HEAD WITHOUT CONTRAST TECHNIQUE: Contiguous axial images were obtained from the base of the skull through the vertex without intravenous contrast. RADIATION DOSE REDUCTION: This exam was performed according to the departmental dose-optimization program which includes automated exposure control, adjustment of the mA and/or kV according to patient size and/or use of iterative reconstruction technique. COMPARISON:  None. FINDINGS: Brain: No evidence of acute  infarction, hemorrhage, hydrocephalus, extra-axial collection or mass lesion/mass effect. Vascular: No hyperdense vessel or unexpected calcification. Skull: Normal. Negative for fracture or focal lesion. Sinuses/Orbits: No acute finding. Other: None. IMPRESSION: No acute intracranial abnormality seen. Electronically Signed   By: Lupita Raider M.D.   On: 01/19/2022 14:35   MR BRAIN WO CONTRAST  Result Date: 01/19/2022 CLINICAL DATA:  Provided history: Neuro deficit, acute, stroke suspected. Additional history provided: Patient with history of hypertension, coronary artery disease status post stent angioplasty, dyslipidemia and daily ETOH presenting with confusion which began this morning while at work. EXAM: MRI HEAD WITHOUT CONTRAST TECHNIQUE: Multiplanar, multiecho pulse sequences of the brain and surrounding structures were obtained without intravenous contrast. COMPARISON:  Noncontrast head CT 01/19/2022. FINDINGS: Brain: Minimal generalized cerebral atrophy. Mild multifocal T2 FLAIR hyperintense signal abnormality within the cerebral white matter, nonspecific but compatible with chronic small vessel ischemic disease. Asymmetric extra-axial CSF intensity prominence overlying the high left frontoparietal lobes, measuring 3.1 x 3.7 x 1.3 cm (for instance as seen on series 10, image 24) (series 17, image 17). This may reflect the presence of an arachnoid cyst. 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 There is no acute infarct. No evidence of an intracranial mass. No chronic intracranial blood products. No midline shift. Vascular: Maintained flow voids within the proximal large arterial vessels. 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 Skull and upper cervical spine: No focal suspicious marrow lesion. Sinuses/Orbits: Visualized orbits show no acute finding. Mild mucosal thickening within the right frontal sinus.  Mild-to-moderate mucosal thickening within the left frontal sinus. Moderate mucosal thickening within the bilateral ethmoid sinuses. Mild-to-moderate mucosal thickening within the bilateral sphenoid sinuses. 1.5 cm mucous retention cyst, and background mild mucosal thickening, within the left maxillary sinus. Partially imaged small mucous retention cyst within the right maxillary sinus. IMPRESSION: No evidence of acute intracranial abnormality. Mild chronic small vessel ischemic changes within the cerebral white matter. 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 Paranasal sinus disease, as outlined. Electronically Signed   By: Jackey Loge D.O.   On: 01/19/2022 19:12    Subjective: Patient was seen and examined today.  No new complaints.  He was at his baseline.  We discussed about checking blood pressure regularly at home and have a close follow-up with PCP for further recommendations.  Discharge Exam: Vitals:   01/20/22 0751 01/20/22 1127  BP: (!) 178/99 (!) 184/92  Pulse: 75 64  Resp: 18 18  Temp: (!) 97.4 F (36.3 C) 97.7 F (36.5 C)  SpO2: 98% 98%   Vitals:   01/19/22 2351 01/20/22 0405 01/20/22 0751 01/20/22 1127  BP: 115/69 133/83 (!) 178/99 (!) 184/92  Pulse: (!) 56 68 75 64  Resp: 18 16 18 18   Temp: 97.8 F (36.6 C) 97.9 F (36.6 C) (!) 97.4 F (36.3 C) 97.7 F (36.5 C)  TempSrc:      SpO2: 98% 99% 98% 98%  Weight:      Height:  General: Pt is alert, awake, not in acute distress Cardiovascular: RRR, S1/S2 +, no rubs, no gallops Respiratory: CTA bilaterally, no wheezing, no rhonchi Abdominal: Soft, NT, ND, bowel sounds + Extremities: no edema, no cyanosis   The results of significant diagnostics from this hospitalization (including imaging, microbiology, ancillary and laboratory) are listed below for reference.    Microbiology: Recent Results (from the past 240 hour(s))  Resp Panel by RT-PCR (Flu A&B, Covid)  Nasopharyngeal Swab     Status: None   Collection Time: 01/19/22  2:15 PM   Specimen: Nasopharyngeal Swab; Nasopharyngeal(NP) swabs in vial transport medium  Result Value Ref Range Status   SARS Coronavirus 2 by RT PCR NEGATIVE NEGATIVE Final    Comment: (NOTE) SARS-CoV-2 target nucleic acids are NOT DETECTED.  The SARS-CoV-2 RNA is generally detectable in upper respiratory specimens during the acute phase of infection. The lowest concentration of SARS-CoV-2 viral copies this assay can detect is 138 copies/mL. A negative result does not preclude SARS-Cov-2 infection and should not be used as the sole basis for treatment or other patient management decisions. A negative result may occur with  improper specimen collection/handling, submission of specimen other than nasopharyngeal swab, presence of viral mutation(s) within the areas targeted by this assay, and inadequate number of viral copies(<138 copies/mL). A negative result must be combined with clinical observations, patient history, and epidemiological information. The expected result is Negative.  Fact Sheet for Patients:  BloggerCourse.comhttps://www.fda.gov/media/152166/download  Fact Sheet for Healthcare Providers:  SeriousBroker.ithttps://www.fda.gov/media/152162/download  This test is no t yet approved or cleared by the [place] FDA and  has been authorized for detection and/or diagnosis of SARS-CoV-2 by FDA under an Emergency Use Authorization (EUA). This EUA will remain  in effect (meaning this test can be used) for the duration of the COVID-19 declaration under Section 564(b)(1) of the Act, 21 U.S.C.section 360bbb-3(b)(1), unless the authorization is terminated  or revoked sooner.       Influenza A by PCR NEGATIVE NEGATIVE Final   Influenza B by PCR NEGATIVE NEGATIVE Final    Comment: (NOTE) The Xpert Xpress SARS-CoV-2/FLU/RSV plus assay is intended as an aid in the diagnosis of influenza from Nasopharyngeal swab specimens and should not be  used as a sole basis for treatment. Nasal washings and aspirates are unacceptable for Xpert Xpress SARS-CoV-2/FLU/RSV testing.  Fact Sheet for Patients: BloggerCourse.comhttps://www.fda.gov/media/152166/download  Fact Sheet for Healthcare Providers: SeriousBroker.ithttps://www.fda.gov/media/152162/download  This test is not yet approved or cleared by the [place] FDA and has been authorized for detection and/or diagnosis of SARS-CoV-2 by FDA under an Emergency Use Authorization (EUA). This EUA will remain in effect (meaning this test can be used) for the duration of the COVID-19 declaration under Section 564(b)(1) of the Act, 21 U.S.C. section 360bbb-3(b)(1), unless the authorization is terminated or revoked.  Performed at [facility], [address] [place], [place] [address]      Labs: BNP (last 3 results) No results for input(s): BNP in the last 8760 hours. Basic Metabolic Panel: Recent Labs  Lab 01/19/22 1404 01/20/22 0550  NA 139 139  K 3.8 4.0  CL 105 106  CO2 26 28  GLUCOSE 110* 111*  BUN 15 18  CREATININE 0.90 0.72  CALCIUM 9.7 9.4   Liver Function Tests: Recent Labs  Lab 01/19/22 1404  AST 24  ALT 25  ALKPHOS 61  BILITOT 1.2  PROT 8.1  ALBUMIN 4.7   No results for input(s): LIPASE, AMYLASE in the last 168 hours. Recent Labs  Lab 01/19/22 1454  AMMONIA 23   CBC: Recent Labs  Lab 01/19/22 1404 01/20/22 0550  WBC 10.6* 8.3  HGB 16.9 15.7  HCT 50.3 47.0  MCV 93.3 91.8  PLT 203 178   Cardiac Enzymes: No results for input(s): CKTOTAL, CKMB, CKMBINDEX, TROPONINI in the last 168 hours. BNP: Invalid input(s): POCBNP CBG: No results for input(s): GLUCAP in the last 168 hours. D-Dimer No results for input(s): DDIMER in the last 72 hours. Hgb A1c No results for input(s): HGBA1C in the last 72 hours. Lipid Profile No results for input(s): CHOL, HDL, LDLCALC, TRIG, CHOLHDL, LDLDIRECT in the last 72 hours. Thyroid function studies No results for input(s):  TSH, T4TOTAL, T3FREE, THYROIDAB in the last 72 hours.  Invalid input(s): FREET3 Anemia work up No results for input(s): VITAMINB12, FOLATE, FERRITIN, TIBC, IRON, RETICCTPCT in the last 72 hours. Urinalysis    Component Value Date/Time   COLORURINE STRAW (A) 01/19/2022 1454   APPEARANCEUR CLEAR (A) 01/19/2022 1454   LABSPEC 1.006 01/19/2022 1454   PHURINE 5.0 01/19/2022 1454   GLUCOSEU NEGATIVE 01/19/2022 1454   HGBUR NEGATIVE 01/19/2022 1454   BILIRUBINUR NEGATIVE 01/19/2022 1454   KETONESUR NEGATIVE 01/19/2022 1454   PROTEINUR NEGATIVE 01/19/2022 1454   NITRITE NEGATIVE 01/19/2022 1454   LEUKOCYTESUR NEGATIVE 01/19/2022 1454   Sepsis Labs Invalid input(s): PROCALCITONIN,  WBC,  LACTICIDVEN Microbiology Recent Results (from the past 240 hour(s))  Resp Panel by RT-PCR (Flu A&B, Covid) Nasopharyngeal Swab     Status: None   Collection Time: 01/19/22  2:15 PM   Specimen: Nasopharyngeal Swab; Nasopharyngeal(NP) swabs in vial transport medium  Result Value Ref Range Status   SARS Coronavirus 2 by RT PCR NEGATIVE NEGATIVE Final    Comment: (NOTE) SARS-CoV-2 target nucleic acids are NOT DETECTED.  The SARS-CoV-2 RNA is generally detectable in upper respiratory specimens during the acute phase of infection. The lowest concentration of SARS-CoV-2 viral copies this assay can detect is 138 copies/mL. A negative result does not preclude SARS-Cov-2 infection and should not be used as the sole basis for treatment or other patient management decisions. A negative result may occur with  improper specimen collection/handling, submission of specimen other than nasopharyngeal swab, presence of viral mutation(s) within the areas targeted by this assay, and inadequate number of viral copies(<138 copies/mL). A negative result must be combined with clinical observations, patient history, and epidemiological information. The expected result is Negative.  Fact Sheet for Patients:   BloggerCourse.com  Fact Sheet for Healthcare Providers:  SeriousBroker.it  This test is no t yet approved or cleared by the [place] FDA and  has been authorized for detection and/or diagnosis of SARS-CoV-2 by FDA under an Emergency Use Authorization (EUA). This EUA will remain  in effect (meaning this test can be used) for the duration of the COVID-19 declaration under Section 564(b)(1) of the Act, 21 U.S.C.section 360bbb-3(b)(1), unless the authorization is terminated  or revoked sooner.       Influenza A by PCR NEGATIVE NEGATIVE Final   Influenza B by PCR NEGATIVE NEGATIVE Final    Comment: (NOTE) The Xpert Xpress SARS-CoV-2/FLU/RSV plus assay is intended as an aid in the diagnosis of influenza from Nasopharyngeal swab specimens and should not be used as a sole basis for treatment. Nasal washings and aspirates are unacceptable for Xpert Xpress SARS-CoV-2/FLU/RSV testing.  Fact Sheet for Patients: BloggerCourse.com  Fact Sheet for Healthcare Providers: SeriousBroker.it  This test is not yet approved or cleared by the [place] and  has been authorized for detection and/or diagnosis of SARS-CoV-2 by FDA under an Emergency Use Authorization (EUA). This EUA will remain in effect (meaning this test can be used) for the duration of the COVID-19 declaration under Section 564(b)(1) of the Act, 21 U.S.C. section 360bbb-3(b)(1), unless the authorization is terminated or revoked.  Performed at [facility], [address] [place], [place] [address]     Time coordinating discharge: Over 30 minutes  SIGNED:  Arnetha Courser, MD  [facility] 01/20/2022, 1:25 PM  If 7PM-7AM, please contact night-coverage [email or web address]  This record has been created using Conservation officer, historic buildings. Errors have been sought and corrected,but may not always be  located. Such creation errors do not reflect on the standard of care.

## 2022-01-27 ENCOUNTER — Ambulatory Visit (INDEPENDENT_AMBULATORY_CARE_PROVIDER_SITE_OTHER): Payer: BC Managed Care – PPO | Admitting: Internal Medicine

## 2022-01-27 ENCOUNTER — Encounter: Payer: Self-pay | Admitting: Internal Medicine

## 2022-01-27 ENCOUNTER — Other Ambulatory Visit: Payer: Self-pay

## 2022-01-27 VITALS — BP 140/84 | HR 68 | Temp 97.1°F | Wt 190.0 lb

## 2022-01-27 DIAGNOSIS — Z1211 Encounter for screening for malignant neoplasm of colon: Secondary | ICD-10-CM | POA: Diagnosis not present

## 2022-01-27 DIAGNOSIS — I161 Hypertensive emergency: Secondary | ICD-10-CM

## 2022-01-27 DIAGNOSIS — Z6829 Body mass index (BMI) 29.0-29.9, adult: Secondary | ICD-10-CM

## 2022-01-27 DIAGNOSIS — I10 Essential (primary) hypertension: Secondary | ICD-10-CM

## 2022-01-27 DIAGNOSIS — E663 Overweight: Secondary | ICD-10-CM | POA: Insufficient documentation

## 2022-01-27 NOTE — Progress Notes (Signed)
Subjective:    Patient ID: Thomas Farley, male    DOB: 1956/06/28, 66 y.o.   MRN: [id number]  HPI  Patient presents to clinic today for ER follow-up.  He presented to the ER 01/19/2022 with complaints of elevated blood pressure and not feeling well.  He was noted to have a blood pressure of 216/120.  He was treated with IV Labetalol and eventually transition to oral Amlodipine.  Labs were unremarkable.  CT head showed:  IMPRESSION: No acute intracranial abnormality seen.   MRI brain showed:  IMPRESSION: No evidence of acute intracranial abnormality. Mild chronic small vessel ischemic changes within the cerebral white matter. 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 Paranasal sinus disease, as outlined   He was discharged on 01/20/2022 and advised to follow-up with his PCP.  Since discharge, he reports he is feeling great. His BP today is 140/84.   He would also like a referral for colonoscopy.  Review of Systems     Past Medical History:  Diagnosis Date   Hyperlipidemia    Hypertension     Current Outpatient Medications  Medication Sig Dispense Refill   amLODipine (NORVASC) 10 MG tablet Take 1 tablet (10 mg total) by mouth daily. 30 tablet 1   aspirin 81 MG tablet Take 81 mg by mouth daily.     ezetimibe (ZETIA) 10 MG tablet TAKE 1 TABLET(10 MG) BY MOUTH DAILY 90 tablet 0   folic acid (FOLVITE) 1 MG tablet Take 1 tablet (1 mg total) by mouth daily. 30 tablet 1   meloxicam (MOBIC) 15 MG tablet Take 15 mg by mouth daily.     Multiple Vitamins-Minerals (MENS MULTI VITAMIN & MINERAL PO) Take 1 tablet by mouth daily. (Patient not taking: Reported on 01/19/2022)     pantoprazole (PROTONIX) 40 MG tablet TAKE 1 TABLET(40 MG) BY MOUTH DAILY 90 tablet 0   rosuvastatin (CRESTOR) 40 MG tablet TAKE 1 TABLET BY MOUTH EVERY DAY 90 tablet 0   thiamine 100 MG tablet Take 1 tablet (100 mg total) by mouth daily. 90 tablet 1   Zinc 50 MG TABS Take by  mouth. (Patient not taking: Reported on 01/19/2022)     No current facility-administered medications for this visit.    No Known Allergies  Family History  Problem Relation Age of Onset   Alzheimer's disease Mother    Prostate cancer Father    Hyperlipidemia Father    Heart disease Father    Alzheimer's disease Father     Social History   Socioeconomic History   Marital status: Married    Spouse name: Not on file   Number of children: Not on file   Years of education: Not on file   Highest education level: Not on file  Occupational History   Not on file  Tobacco Use   Smoking status: Never   Smokeless tobacco: Never  Substance and Sexual Activity   Alcohol use: Yes    Alcohol/week: 0.0 standard drinks    Comment: up to 5 times weekly---beer or wine   Drug use: No   Sexual activity: Yes  Other Topics Concern   Not on file  Social History Narrative   Not on file   Social Determinants of Health   Financial Resource Strain: Not on file  Food Insecurity: Not on file  Transportation Needs: Not on file  Physical Activity: Not on file  Stress: Not on file  Social Connections: Not on file  Intimate  Partner Violence: Not on file     Constitutional: Denies fever, malaise, fatigue, headache or abrupt weight changes.  Respiratory: Denies difficulty breathing, shortness of breath, cough or sputum production.   Cardiovascular: Denies chest pain, chest tightness, palpitations or swelling in the hands or feet.  Neurological: Denies dizziness, difficulty with memory, difficulty with speech or problems with balance and coordination.    No other specific complaints in a complete review of systems (except as listed in HPI above).  Objective:   Physical Exam BP 140/84 (BP Location: Right Arm, Patient Position: Sitting, Cuff Size: Large)    Pulse 68    Temp (!) 97.1 F (36.2 C) (Temporal)    Wt 190 lb (86.2 kg)    SpO2 100%    BMI 29.76 kg/m   Wt Readings from Last 3  Encounters:  01/19/22 192 lb 14.4 oz (87.5 kg)  12/23/20 191 lb (86.6 kg)  11/03/20 193 lb 8 oz (87.8 kg)    General: Appears his stated age, overweight, in NAD. HEENT: Head: normal shape and size; Eyes: EOMs intact;  Cardiovascular: Normal rate and rhythm. S1,S2 noted.  No murmur, rubs or gallops noted. No JVD or BLE edema. No carotid bruits noted. Pulmonary/Chest: Normal effort and positive vesicular breath sounds. No respiratory distress. No wheezes, rales or ronchi noted.  Musculoskeletal: No difficulty with gait.  Neurological: Alert and oriented.    BMET    Component Value Date/Time   NA 139 01/20/2022 0550   K 4.0 01/20/2022 0550   CL 106 01/20/2022 0550   CO2 28 01/20/2022 0550   GLUCOSE 111 (H) 01/20/2022 0550   BUN 18 01/20/2022 0550   CREATININE 0.72 01/20/2022 0550   CREATININE 0.88 10/15/2016 1653   CALCIUM 9.4 01/20/2022 0550   GFRNONAA >60 01/20/2022 0550    Lipid Panel     Component Value Date/Time   CHOL 172 12/23/2020 1539   TRIG 297.0 (H) 12/23/2020 1539   HDL 39.10 12/23/2020 1539   CHOLHDL 4 12/23/2020 1539   VLDL 59.4 (H) 12/23/2020 1539   LDLCALC 218 (H) 12/04/2019 1437    CBC    Component Value Date/Time   WBC 8.3 01/20/2022 0550   RBC 5.12 01/20/2022 0550   HGB 15.7 01/20/2022 0550   HCT 47.0 01/20/2022 0550   PLT 178 01/20/2022 0550   MCV 91.8 01/20/2022 0550   MCH 30.7 01/20/2022 0550   MCHC 33.4 01/20/2022 0550   RDW 13.4 01/20/2022 0550   LYMPHSABS 2,047 10/15/2016 1653   MONOABS 712 10/15/2016 1653   EOSABS 890 (H) 10/15/2016 1653   BASOSABS 0 10/15/2016 1653    Hgb A1C No results found for: HGBA1C          Assessment & Plan:   Hospital follow-up for Hypertensive Emergency:  Hospital notes, labs and imaging reviewed Continue Amlodipine 10 mg daily Reinforced DASH diet and exercise for weight loss Rx for blood pressure cuff-monitor daily US renal artery stenosis ordered  Screen for Colon Cancer:  Referral to  GI for screening colonoscopy Schedule appointment for annual exam     Nicki Reaper, NP This visit occurred during the SARS-CoV-2 public health emergency.  Safety protocols were in place, including screening questions prior to the visit, additional usage of staff PPE, and extensive cleaning of exam room while observing appropriate contact time as indicated for disinfecting solutions.

## 2022-01-27 NOTE — Patient Instructions (Signed)

## 2022-01-27 NOTE — Assessment & Plan Note (Signed)
Encourage diet and exercise for weight loss 

## 2022-02-01 ENCOUNTER — Telehealth: Payer: Self-pay

## 2022-02-01 ENCOUNTER — Ambulatory Visit: Payer: BC Managed Care – PPO

## 2022-02-01 NOTE — Telephone Encounter (Signed)
CALLED PATIENT NO ANSWER LEFT VOICEMAIL FOR A CALL BACK °Letter sent °

## 2022-02-04 ENCOUNTER — Ambulatory Visit: Payer: Self-pay

## 2022-02-04 NOTE — Telephone Encounter (Signed)
Apt 02/16/2022 8am.  Pt advised.   Thanks,   -Vernona Rieger

## 2022-02-04 NOTE — Telephone Encounter (Signed)
°  Chief Complaint: lightheadedness Symptoms: BP 179/109, 167/112, lightheaded, dizzy Frequency: 1 hr ago Pertinent Negatives: Patient denies feeling SOB, blurred vision, or headache Disposition: [] ED /[] Urgent Care (no appt availability in office) / [] Appointment(In office/virtual)/ []  [facility]/ [] Home Care/ [] Refused Recommended Disposition /[] [facility]/ []  Follow-up with PCP Additional Notes: attempted to call [facility] but closed for lunch, pt has left work and advised to go home, lie down and rest and recheck BP in 1 hr after resting, if hadn't heard from office by 2pm to call back and if BP remains high to go to ED. Pt preferred to come to office rather than going back by to ED.    Summary: BP concerns / light headedness   The patient has been newly prescribed blood pressure medication   The patient is currently feeling light headed but has experienced no dizziness or fainting   The patient would like to discuss further   Please contact when available      Reason for Disposition  Systolic BP  >= 180 OR Diastolic >= 110  Answer Assessment - Initial Assessment Questions 1. BLOOD PRESSURE: "What is the blood pressure?" "Did you take at least two measurements 5 minutes apart?"     179/109, 167/112 2. ONSET: "When did you take your blood pressure?"     Just now 3. HOW: "How did you obtain the blood pressure?" (e.g., visiting nurse, automatic home BP monitor)     Automatic cuff 4. HISTORY: "Do you have a history of high blood pressure?"     yes 5. MEDICATIONS: "Are you taking any medications for blood pressure?" "Have you missed any doses recently?"    Yes and no 6. OTHER SYMPTOMS: "Do you have any symptoms?" (e.g., headache, chest pain, blurred vision, difficulty breathing, weakness)     lightheadedness  Protocols used: Blood Pressure - High-A-AH

## 2022-02-04 NOTE — Telephone Encounter (Signed)
If he wants to follow-up in 1 to 2 weeks that is fine

## 2022-02-04 NOTE — Telephone Encounter (Signed)
I spoke with Thomas Farley he states since he got home his BP is back down to 145//94 which is about what he usually gets at home.  He states he is feeling better now (no longer dizzy, lightheaded.)  Do you want to see him soon for a follow up visit?  Thanks,  -Vernona Rieger

## 2022-02-16 ENCOUNTER — Other Ambulatory Visit: Payer: Self-pay

## 2022-02-16 ENCOUNTER — Encounter: Payer: Self-pay | Admitting: Internal Medicine

## 2022-02-16 ENCOUNTER — Ambulatory Visit (INDEPENDENT_AMBULATORY_CARE_PROVIDER_SITE_OTHER): Payer: BC Managed Care – PPO | Admitting: Internal Medicine

## 2022-02-16 VITALS — BP 138/79 | HR 69 | Temp 97.3°F | Wt 188.0 lb

## 2022-02-16 DIAGNOSIS — Z6829 Body mass index (BMI) 29.0-29.9, adult: Secondary | ICD-10-CM | POA: Diagnosis not present

## 2022-02-16 DIAGNOSIS — E663 Overweight: Secondary | ICD-10-CM | POA: Diagnosis not present

## 2022-02-16 DIAGNOSIS — I10 Essential (primary) hypertension: Secondary | ICD-10-CM | POA: Diagnosis not present

## 2022-02-16 MED ORDER — AMLODIPINE BESYLATE 10 MG PO TABS: 10.0000 mg | ORAL_TABLET | Freq: Every day | ORAL | 1 refills | Status: DC

## 2022-02-16 NOTE — Assessment & Plan Note (Addendum)
Controlled on Amlodipine, refilled today ?Reinforced DASH diet and exercise for weight loss ?Rx for DME blood pressure cuff ?We will monitor ?

## 2022-02-16 NOTE — Assessment & Plan Note (Signed)
Encourage diet and exercise for weight loss 

## 2022-02-16 NOTE — Progress Notes (Signed)
? ?Subjective:  ? ? Patient ID: Thomas Farley, male    DOB: 01-Nov-1956, 66 y.o.   MRN: [id number] ? ?HPI ? ?Patient presents to clinic today for 2-week follow-up of HTN.  He is taking amlodipine as prescribed.  He denies adverse side effects.  There is a renal artery ultrasound pending.  His BP today is 138/79.  He brought his blood pressure cuff for calibration and realized that it is not accurate.  ECG from 01/2022 reviewed. ? ?Review of Systems ? ?   ?Past Medical History:  ?Diagnosis Date  ? Hyperlipidemia   ? Hypertension   ? ? ?Current Outpatient Medications  ?Medication Sig Dispense Refill  ? amLODipine (NORVASC) 10 MG tablet Take 1 tablet (10 mg total) by mouth daily. 30 tablet 1  ? aspirin 81 MG tablet Take 81 mg by mouth daily.    ? ezetimibe (ZETIA) 10 MG tablet TAKE 1 TABLET(10 MG) BY MOUTH DAILY 90 tablet 0  ? folic acid (FOLVITE) 1 MG tablet Take 1 tablet (1 mg total) by mouth daily. 30 tablet 1  ? pantoprazole (PROTONIX) 40 MG tablet TAKE 1 TABLET(40 MG) BY MOUTH DAILY 90 tablet 0  ? rosuvastatin (CRESTOR) 40 MG tablet TAKE 1 TABLET BY MOUTH EVERY DAY 90 tablet 0  ? ?No current facility-administered medications for this visit.  ? ? ?No Known Allergies ? ?Family History  ?Problem Relation Age of Onset  ? Alzheimer's disease Mother   ? Prostate cancer Father   ? Hyperlipidemia Father   ? Heart disease Father   ? Alzheimer's disease Father   ? ? ?Social History  ? ?Socioeconomic History  ? Marital status: Married  ?  Spouse name: Not on file  ? Number of children: Not on file  ? Years of education: Not on file  ? Highest education level: Not on file  ?Occupational History  ? Not on file  ?Tobacco Use  ? Smoking status: Never  ? Smokeless tobacco: Never  ?Substance and Sexual Activity  ? Alcohol use: Yes  ?  Alcohol/week: 0.0 standard drinks  ?  Comment: up to 5 times weekly---beer or wine  ? Drug use: No  ? Sexual activity: Yes  ?Other Topics Concern  ? Not on file  ?Social History Narrative  ? Not on  file  ? ?Social Determinants of Health  ? ?Financial Resource Strain: Not on file  ?Food Insecurity: Not on file  ?Transportation Needs: Not on file  ?Physical Activity: Not on file  ?Stress: Not on file  ?Social Connections: Not on file  ?Intimate Partner Violence: Not on file  ? ? ? ?Constitutional: Denies fever, malaise, fatigue, headache or abrupt weight changes.  ?Respiratory: Denies difficulty breathing, shortness of breath, cough or sputum production.   ?Cardiovascular: Denies chest pain, chest tightness, palpitations or swelling in the hands or feet.  ?Musculoskeletal: Denies decrease in range of motion, difficulty with gait, muscle pain or joint pain and swelling.  ?Skin: Denies redness, rashes, lesions or ulcercations.  ?Neurological: Denies dizziness, difficulty with memory, difficulty with speech or problems with balance and coordination.  ? ?No other specific complaints in a complete review of systems (except as listed in HPI above). ? ?Objective:  ? Physical Exam ? ?BP 138/79 (BP Location: Right Arm, Patient Position: Sitting, Cuff Size: Large)   Pulse 69   Temp (!) 97.3 ?F (36.3 ?C) (Temporal)   Wt 188 lb (85.3 kg)   SpO2 100%   BMI 29.44 kg/m?  ?Wt Readings  from Last 3 Encounters:  ?02/16/22 188 lb (85.3 kg)  ?01/27/22 190 lb (86.2 kg)  ?01/19/22 192 lb 14.4 oz (87.5 kg)  ? ? ?General: Appears his stated age, overweight, in NAD. ?HEENT: Head: normal shape and size; Eyes: EOMs intact;  ?Cardiovascular: Normal rate and rhythm. S1,S2 noted.  No murmur, rubs or gallops noted.  ?Pulmonary/Chest: Normal effort and positive vesicular breath sounds. No respiratory distress. No wheezes, rales or ronchi noted.  ?Musculoskeletal: No difficulty with gait.  ?Neurological: Alert and oriented.  ? ? ?BMET ?   ?Component Value Date/Time  ? NA 139 01/20/2022 0550  ? K 4.0 01/20/2022 0550  ? CL 106 01/20/2022 0550  ? CO2 28 01/20/2022 0550  ? GLUCOSE 111 (H) 01/20/2022 0550  ? BUN 18 01/20/2022 0550  ? CREATININE  0.72 01/20/2022 0550  ? CREATININE 0.88 10/15/2016 1653  ? CALCIUM 9.4 01/20/2022 0550  ? GFRNONAA >60 01/20/2022 0550  ? ? ?Lipid Panel  ?   ?Component Value Date/Time  ? CHOL 172 12/23/2020 1539  ? TRIG 297.0 (H) 12/23/2020 1539  ? HDL 39.10 12/23/2020 1539  ? CHOLHDL 4 12/23/2020 1539  ? VLDL 59.4 (H) 12/23/2020 1539  ? LDLCALC 218 (H) 12/04/2019 1437  ? ? ?CBC ?   ?Component Value Date/Time  ? WBC 8.3 01/20/2022 0550  ? RBC 5.12 01/20/2022 0550  ? HGB 15.7 01/20/2022 0550  ? HCT 47.0 01/20/2022 0550  ? PLT 178 01/20/2022 0550  ? MCV 91.8 01/20/2022 0550  ? MCH 30.7 01/20/2022 0550  ? MCHC 33.4 01/20/2022 0550  ? RDW 13.4 01/20/2022 0550  ? LYMPHSABS 2,047 10/15/2016 1653  ? MONOABS 712 10/15/2016 1653  ? EOSABS 890 (H) 10/15/2016 1653  ? BASOSABS 0 10/15/2016 1653  ? ? ?Hgb A1C ?No results found for: HGBA1C ? ? ? ? ? ? ?   ?Assessment & Plan:  ? ?Nicki Reaper, NP ? ?This visit occurred during the SARS-CoV-2 public health emergency.  Safety protocols were in place, including screening questions prior to the visit, additional usage of staff PPE, and extensive cleaning of exam room while observing appropriate contact time as indicated for disinfecting solutions.  ? ? ?

## 2022-02-16 NOTE — Patient Instructions (Signed)
How to Take Your Blood Pressure ?Blood pressure measures how strongly your blood is pressing against the walls of your arteries. Arteries are blood vessels that carry blood from your heart throughout your body. You can take your blood pressure at home with a machine. ?You may need to check your blood pressure at home: ?To check if you have high blood pressure (hypertension). ?To check your blood pressure over time. ?To make sure your blood pressure medicine is working. ?Supplies needed: ?Blood pressure machine, or monitor. ?Dining room chair to sit in. ?Table or desk. ?Small notebook. ?Pencil or pen. ?How to prepare ?Avoid these things for 30 minutes before checking your blood pressure: ?Having drinks with caffeine in them, such as coffee or tea. ?Drinking alcohol. ?Eating. ?Smoking. ?Exercising. ?Do these things five minutes before checking your blood pressure: ?Go to the bathroom and pee (urinate). ?Sit in a dining chair. Do not sit on a soft couch or an armchair. ?Be quiet. Do not talk. ?How to take your blood pressure ?Follow the instructions that came with your machine. If you have a digital blood pressure monitor, these may be the instructions: ?Sit up straight. ?Place your feet on the floor. Do not cross your ankles or legs. ?Rest your left arm at the level of your heart. You may rest it on a table, desk, or chair. ?Pull up your shirt sleeve. ?Wrap the blood pressure cuff around the upper part of your left arm. The cuff should be 1 inch (2.5 cm) above your elbow. It is best to wrap the cuff around bare skin. ?Fit the cuff snugly around your arm. You should be able to place only one finger between the cuff and your arm. ?Place the cord so that it rests in the bend of your elbow. ?Press the power button. ?Sit quietly while the cuff fills with air and loses air. ?Write down the numbers on the screen. ?Wait 2-3 minutes and then repeat steps 1-10. ?What do the numbers mean? ?Two numbers make up your blood  pressure. The first number is called systolic pressure. The second is called diastolic pressure. An example of a blood pressure reading is "120 over 80" (or 120/80). ?If you are an adult and do not have a medical condition, use this guide to find out if your blood pressure is normal: ?Normal ?First number: below 120. ?Second number: below 80. ?Elevated ?First number: 120-129. ?Second number: below 80. ?Hypertension stage 1 ?First number: 130-139. ?Second number: 80-89. ?Hypertension stage 2 ?First number: 140 or above. ?Second number: 90 or above. ?Your blood pressure is above normal even if only the first or only the second number is above normal. ?Follow these instructions at home: ?Medicines ?Take over-the-counter and prescription medicines only as told by your doctor. ?Tell your doctor if your medicine is causing side effects. ?General instructions ?Check your blood pressure as often as your doctor tells you to. ?Check your blood pressure at the same time every day. ?Take your monitor to your next doctor's appointment. Your doctor will: ?Make sure you are using it correctly. ?Make sure it is working right. ?Understand what your blood pressure numbers should be. ?Keep all follow-up visits as told by your doctor. This is important. ?General tips ?You will need a blood pressure machine, or monitor. Your doctor can suggest a monitor. You can buy one at a drugstore or online. When choosing one: ?Choose one with an arm cuff. ?Choose one that wraps around your upper arm. Only one finger should fit between   your arm and the cuff. ?Do not choose one that measures your blood pressure from your wrist or finger. ?Where to find more information ?American Heart Association: www.heart.org ?Contact a doctor if: ?Your blood pressure keeps being high. ?Your blood pressure is suddenly low. ?Get help right away if: ?Your first blood pressure number is higher than 180. ?Your second blood pressure number is higher than  120. ?Summary ?Check your blood pressure at the same time every day. ?Avoid caffeine, alcohol, smoking, and exercise for 30 minutes before checking your blood pressure. ?Make sure you understand what your blood pressure numbers should be. ?This information is not intended to replace advice given to you by your health care provider. Make sure you discuss any questions you have with your health care provider. ?Document Revised: 10/08/2020 Document Reviewed: 11/23/2019 ?Elsevier Patient Education ? 2022 Elsevier Inc. ? ?

## 2022-03-02 DIAGNOSIS — M25562 Pain in left knee: Secondary | ICD-10-CM | POA: Diagnosis not present

## 2022-03-02 DIAGNOSIS — M2392 Unspecified internal derangement of left knee: Secondary | ICD-10-CM | POA: Diagnosis not present

## 2022-03-09 DIAGNOSIS — M25562 Pain in left knee: Secondary | ICD-10-CM | POA: Diagnosis not present

## 2022-03-12 DIAGNOSIS — M25562 Pain in left knee: Secondary | ICD-10-CM | POA: Diagnosis not present

## 2022-04-21 ENCOUNTER — Other Ambulatory Visit: Payer: Self-pay | Admitting: Internal Medicine

## 2022-04-22 NOTE — Telephone Encounter (Signed)
Requested Prescriptions  ?Pending Prescriptions Disp Refills  ?? pantoprazole (PROTONIX) 40 MG tablet [Pharmacy Med Name: PANTOPRAZOLE 40MG  TABLETS] 90 tablet 0  ?  Sig: TAKE 1 TABLET(40 MG) BY MOUTH DAILY  ?  ? There is no refill protocol information for this order  ?  ?? rosuvastatin (CRESTOR) 40 MG tablet [Pharmacy Med Name: ROSUVASTATIN 40MG  TABLETS] 90 tablet 0  ?  Sig: TAKE 1 TABLET BY MOUTH EVERY DAY  ?  ? There is no refill protocol information for this order  ?  ?? ezetimibe (ZETIA) 10 MG tablet [Pharmacy Med Name: EZETIMIBE 10MG  TABLETS] 90 tablet 0  ?  Sig: TAKE 1 TABLET(10 MG) BY MOUTH DAILY  ?  ? There is no refill protocol information for this order  ?  ? ? ?

## 2022-04-22 NOTE — Telephone Encounter (Signed)
Medication was refilled [id number], this is a duplicate request. Will refuse. ? ? ?Requested Prescriptions  ?Pending Prescriptions Disp Refills  ?? pantoprazole (PROTONIX) 40 MG tablet [Pharmacy Med Name: PANTOPRAZOLE 40MG  TABLETS] 90 tablet 0  ?  Sig: TAKE 1 TABLET(40 MG) BY MOUTH DAILY  ?  ? There is no refill protocol information for this order  ?  ? ? ?

## 2022-07-18 ENCOUNTER — Other Ambulatory Visit: Payer: Self-pay | Admitting: Internal Medicine

## 2022-07-19 MED ORDER — PANTOPRAZOLE SODIUM 40 MG PO TBEC: DELAYED_RELEASE_TABLET | ORAL | 0 refills | Status: DC

## 2022-07-19 MED ORDER — ROSUVASTATIN CALCIUM 40 MG PO TABS: 40.0000 mg | ORAL_TABLET | Freq: Every day | ORAL | 0 refills | Status: DC

## 2022-07-19 MED ORDER — EZETIMIBE 10 MG PO TABS: ORAL_TABLET | ORAL | 0 refills | Status: DC

## 2022-07-19 NOTE — Telephone Encounter (Signed)
Requested medication (s) are due for refill today:   Not sure  Requested medication (s) are on the active medication list:   Yes  Future visit scheduled:   No   He has been seen by Nicki Reaper at [facility]   Last ordered: All 3 refills 04/22/2022 #90, 0 refills each  Returned because no protocol attached to these medications.   Requested Prescriptions  Pending Prescriptions Disp Refills   ezetimibe (ZETIA) 10 MG tablet [Pharmacy Med Name: EZETIMIBE 10MG  TABLETS] 90 tablet 0    Sig: TAKE 1 TABLET(10 MG) BY MOUTH DAILY     There is no refill protocol information for this order     rosuvastatin (CRESTOR) 40 MG tablet [Pharmacy Med Name: ROSUVASTATIN 40MG  TABLETS] 90 tablet 0    Sig: TAKE 1 TABLET BY MOUTH EVERY DAY     There is no refill protocol information for this order     pantoprazole (PROTONIX) 40 MG tablet [Pharmacy Med Name: PANTOPRAZOLE 40MG  TABLETS] 90 tablet 0    Sig: TAKE 1 TABLET(40 MG) BY MOUTH DAILY     There is no refill protocol information for this order

## 2022-07-19 NOTE — Addendum Note (Signed)
Addended by: Lorre Munroe on: 07/19/2022 04:11 PM   Modules accepted: Orders

## 2022-07-31 IMAGING — CT CT HEAD W/O CM
4 series · 17 of 47 positions shown, 19 images · non-contrast
Comparison: None.

CLINICAL DATA: Altered mental status.



[Series 2: head bone · axial · 0.44mm/px · z∈[+268,+324]mm · 4 of 79 slices shown]
[im 8/79  bone]
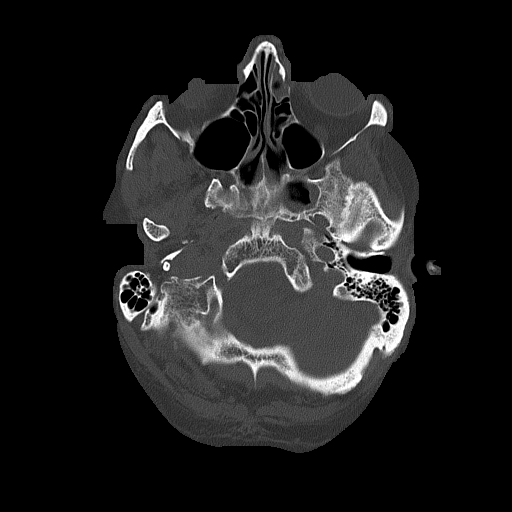
[im 16/79  bone]
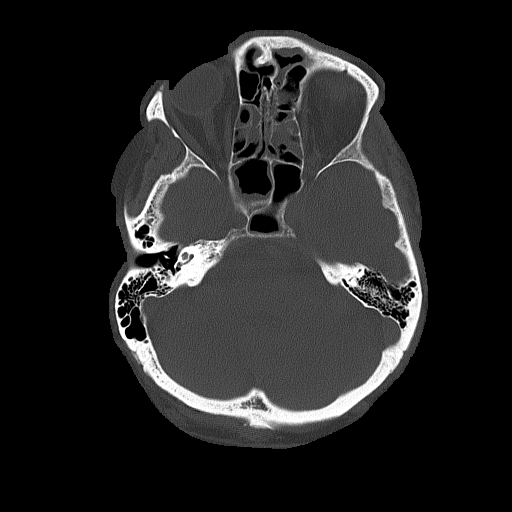
[im 24/79  bone]
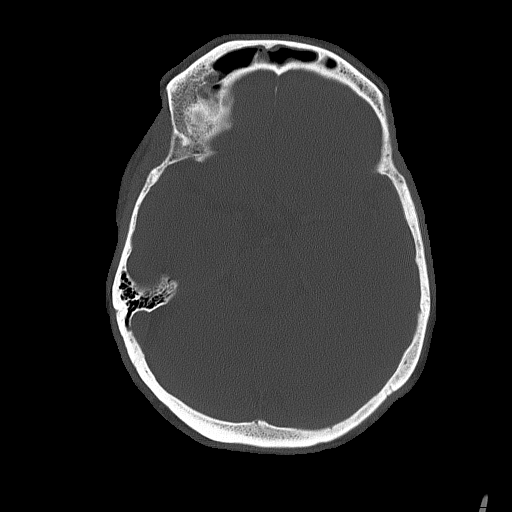
[im 36/79  bone]
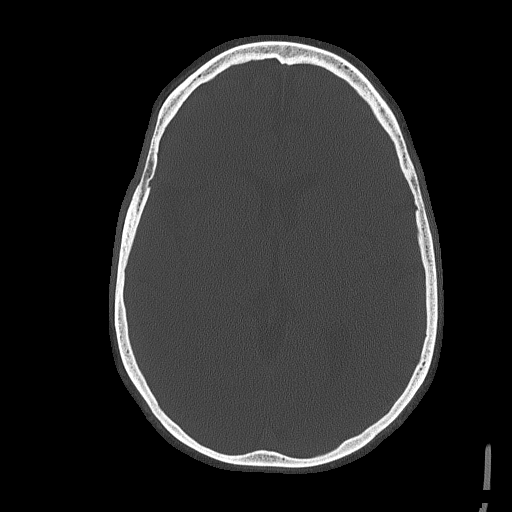

[Series 3: head wo · axial · 0.44mm/px · z∈[+269,+389]mm · 7 of 32 slices shown, 9 images]
[im 4/32  brain]
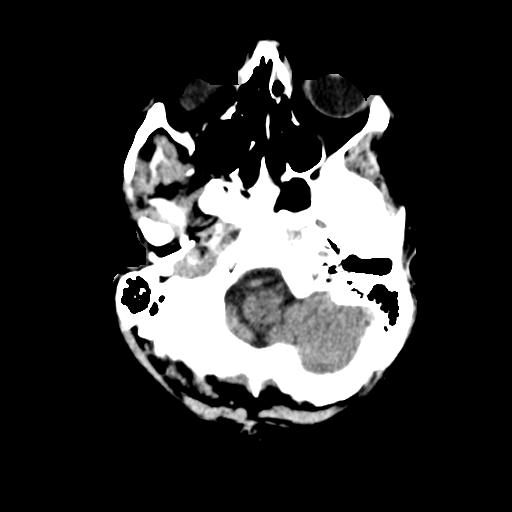
[im 4/32  bone]
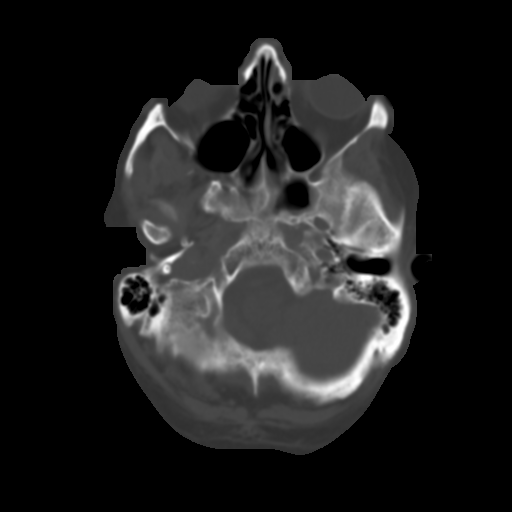
[im 8/32  brain]
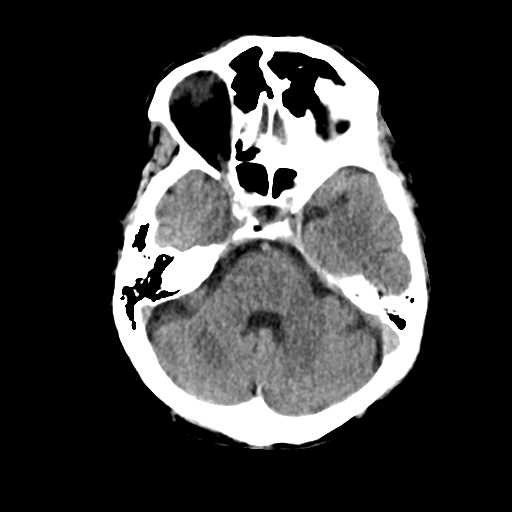
[im 12/32  brain]
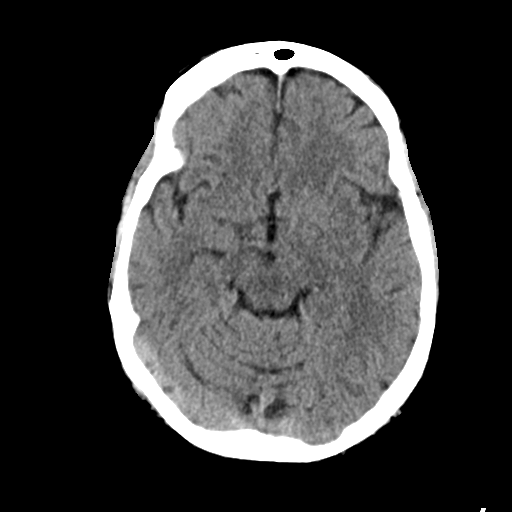
[im 16/32  brain]
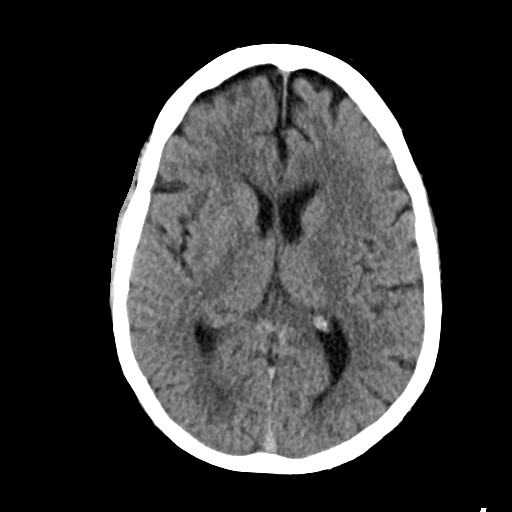
[im 20/32  brain]
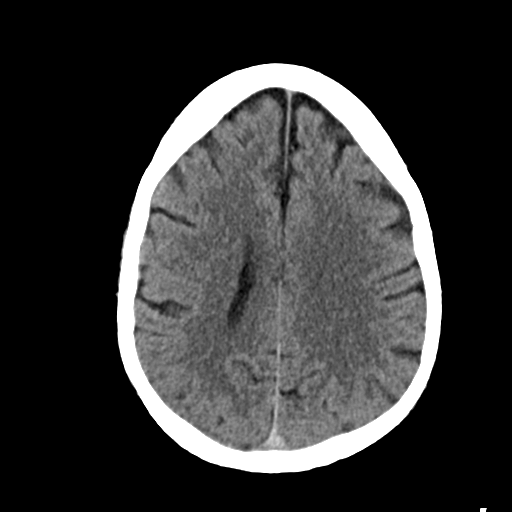
[im 20/32  bone]
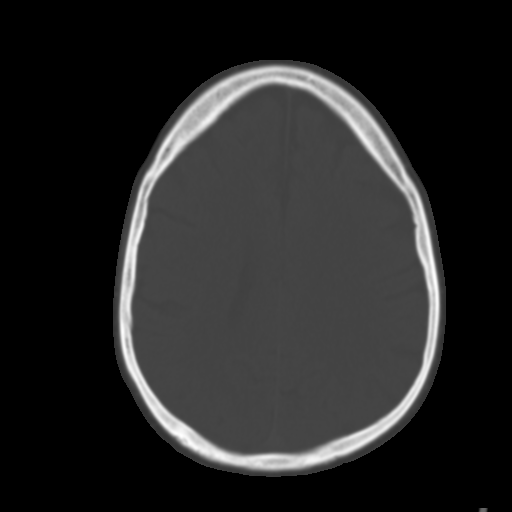
[im 24/32  brain]
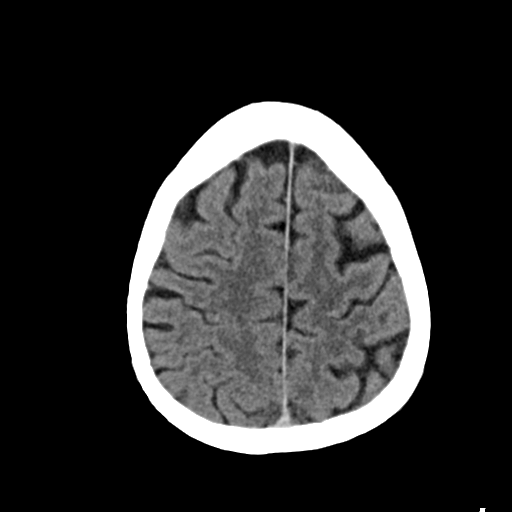
[im 28/32  brain]
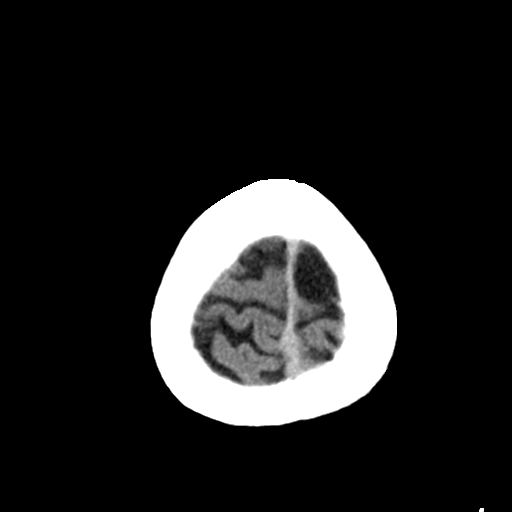

[Series 4: coronal soft tissue · coronal · 0.31mm/px · 3 of 66 slices shown]
[im 22/66  brain]
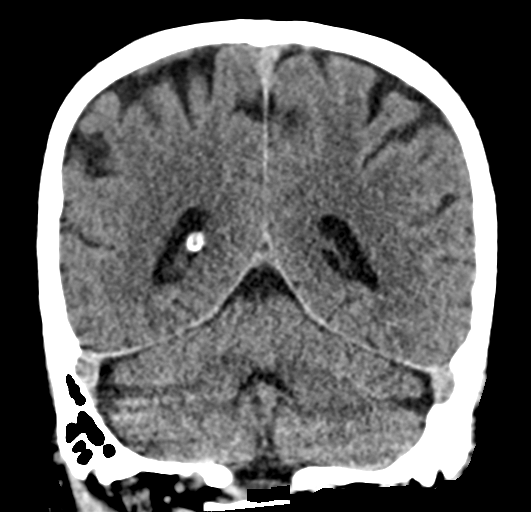
[im 29/66  brain]
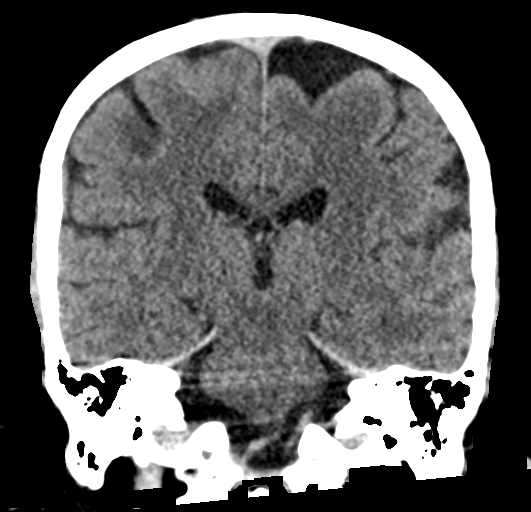
[im 37/66  brain]
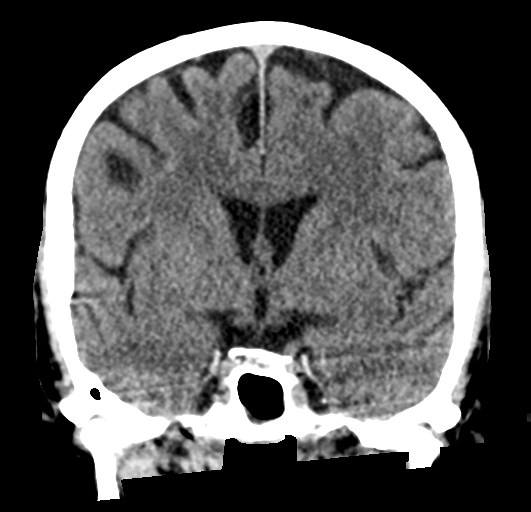

[Series 5: sagittal soft tissue · sagittal · 0.31mm/px · 3 of 55 slices shown]
[im 19/55  brain]
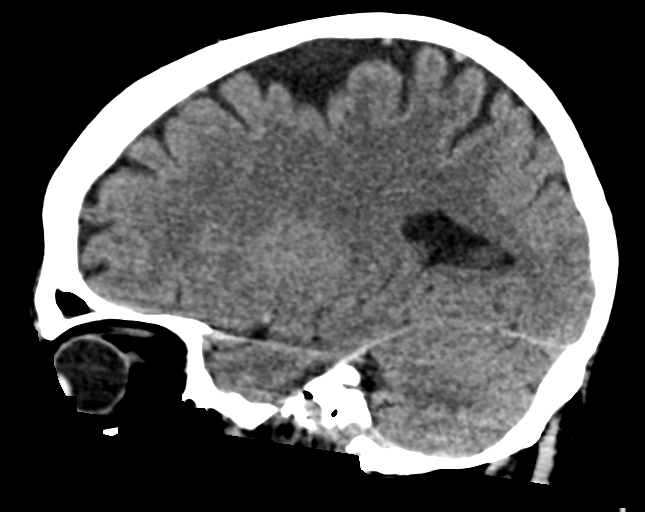
[im 28/55  brain]
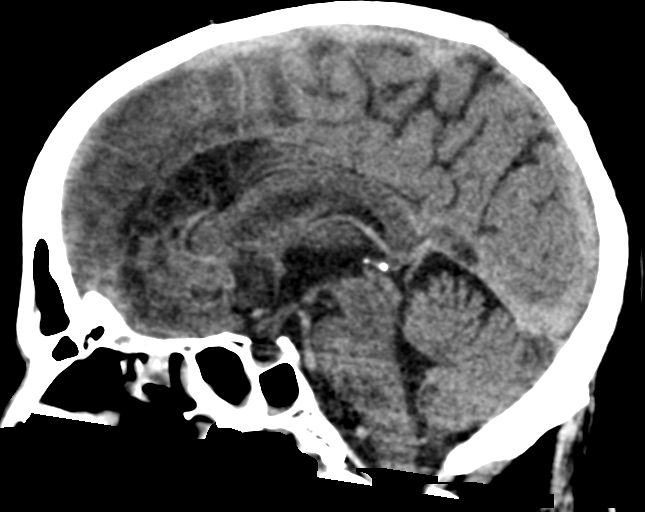
[im 37/55  brain]
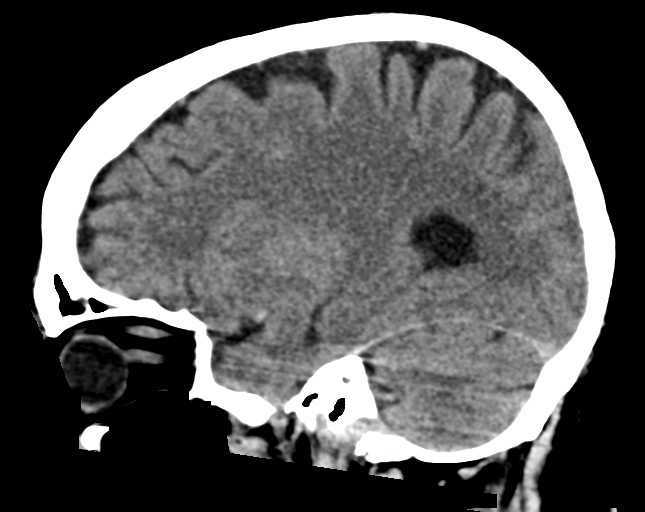

[17 of 47 positions shown; findings below may reference images not displayed]

FINDINGS: Brain: No evidence of acute infarction, hemorrhage, hydrocephalus,
extra-axial collection or mass lesion/mass effect.

Vascular: No hyperdense vessel or unexpected calcification.

Skull: Normal. Negative for fracture or focal lesion.

Sinuses/Orbits: No acute finding.

Other: None.
IMPRESSION: No acute intracranial abnormality seen.

## 2022-09-08 DIAGNOSIS — S83242A Other tear of medial meniscus, current injury, left knee, initial encounter: Secondary | ICD-10-CM | POA: Diagnosis not present

## 2022-09-11 ENCOUNTER — Other Ambulatory Visit: Payer: Self-pay | Admitting: Internal Medicine

## 2022-09-13 NOTE — Telephone Encounter (Signed)
Requested Prescriptions  Pending Prescriptions Disp Refills  . amLODipine (NORVASC) 10 MG tablet [Pharmacy Med Name: AMLODIPINE BESYLATE 10MG  TABLETS] 90 tablet 0    Sig: TAKE 1 TABLET(10 MG) BY MOUTH DAILY     Cardiovascular: Calcium Channel Blockers 2 Failed - 09/11/2022 11:16 AM      Failed - Valid encounter within last 6 months    Recent Outpatient Visits          6 months ago Essential hypertension   [place], Coralie Keens, NP   7 months ago Hypertensive emergency   [facility] [place], [place] W, NP             Passed - Last BP in normal range    BP Readings from Last 1 Encounters:  02/16/22 138/79         Passed - Last Heart Rate in normal range    Pulse Readings from Last 1 Encounters:  02/16/22 69

## 2022-10-16 ENCOUNTER — Other Ambulatory Visit: Payer: Self-pay | Admitting: Internal Medicine

## 2022-10-18 NOTE — Telephone Encounter (Signed)
Requested medications are due for refill today.  yes  Requested medications are on the active medications list.  yes  Last refill. Both refilled 07/19/2022 #90 0 rf  Future visit scheduled.   no  Notes to clinic.  Labs are expired.    Requested Prescriptions  Pending Prescriptions Disp Refills   rosuvastatin (CRESTOR) 40 MG tablet [Pharmacy Med Name: ROSUVASTATIN 40MG  TABLETS] 90 tablet 0    Sig: TAKE 1 TABLET(40 MG) BY MOUTH DAILY     Cardiovascular:  Antilipid - Statins 2 Failed - 10/18/2022  9:06 AM      Failed - Lipid Panel in normal range within the last 12 months    Cholesterol  Date Value Ref Range Status  12/23/2020 172 0 - 200 mg/dL Final    Comment:    ATP III Classification       Desirable:  < 200 mg/dL               Borderline High:  200 - 239 mg/dL          High:  > = 240 mg/dL   LDL Cholesterol  Date Value Ref Range Status  12/04/2019 218 (H) 0 - 99 mg/dL Final   Direct LDL  Date Value Ref Range Status  12/23/2020 114.0 mg/dL Final    Comment:    Optimal:  <100 mg/dLNear or Above Optimal:  100-129 mg/dLBorderline High:  130-159 mg/dLHigh:  160-189 mg/dLVery High:  >190 mg/dL   HDL  Date Value Ref Range Status  12/23/2020 39.10 >39.00 mg/dL Final   Triglycerides  Date Value Ref Range Status  12/23/2020 297.0 (H) 0.0 - 149.0 mg/dL Final    Comment:    Normal:  <150 mg/dLBorderline High:  150 - 199 mg/dL         Passed - Cr in normal range and within 360 days    Creat  Date Value Ref Range Status  10/15/2016 0.88 0.70 - 1.25 mg/dL Final    Comment:      For patients > or = 66 years of age: The upper reference limit for Creatinine is approximately 13% higher for people identified as African-American.      Creatinine, Ser  Date Value Ref Range Status  01/20/2022 0.72 0.61 - 1.24 mg/dL Final         Passed - Patient is not pregnant      Passed - Valid encounter within last 12 months    Recent Outpatient Visits           8 months ago  Essential hypertension   [place], Coralie Keens, NP   8 months ago Hypertensive emergency   [facility] [place], Coralie Keens, NP               ezetimibe (ZETIA) 10 MG tablet [Pharmacy Med Name: EZETIMIBE 10MG  TABLETS] 90 tablet 0    Sig: TAKE 1 TABLET(10 MG) BY MOUTH DAILY     Cardiovascular:  Antilipid - Sterol Transport Inhibitors Failed - 10/18/2022  9:06 AM      Failed - Lipid Panel in normal range within the last 12 months    Cholesterol  Date Value Ref Range Status  12/23/2020 172 0 - 200 mg/dL Final    Comment:    ATP III Classification       Desirable:  < 200 mg/dL               Borderline High:  200 - 239 mg/dL  High:  > = 240 mg/dL   LDL Cholesterol  Date Value Ref Range Status  12/04/2019 218 (H) 0 - 99 mg/dL Final   Direct LDL  Date Value Ref Range Status  12/23/2020 114.0 mg/dL Final    Comment:    Optimal:  <100 mg/dLNear or Above Optimal:  100-129 mg/dLBorderline High:  130-159 mg/dLHigh:  160-189 mg/dLVery High:  >190 mg/dL   HDL  Date Value Ref Range Status  12/23/2020 39.10 >39.00 mg/dL Final   Triglycerides  Date Value Ref Range Status  12/23/2020 297.0 (H) 0.0 - 149.0 mg/dL Final    Comment:    Normal:  <150 mg/dLBorderline High:  150 - 199 mg/dL         Passed - AST in normal range and within 360 days    AST  Date Value Ref Range Status  01/19/2022 24 15 - 41 U/L Final         Passed - ALT in normal range and within 360 days    ALT  Date Value Ref Range Status  01/19/2022 25 0 - 44 U/L Final         Passed - Patient is not pregnant      Passed - Valid encounter within last 12 months    Recent Outpatient Visits           8 months ago Essential hypertension   [facility] [place], Salvadore [place], NP   8 months ago Hypertensive emergency   [facility] [place], Salvadore Oxford, NP              Signed Prescriptions Disp Refills   pantoprazole (PROTONIX) 40 MG tablet 90  tablet 1    Sig: TAKE 1 TABLET(40 MG) BY MOUTH DAILY     Gastroenterology: Proton Pump Inhibitors Passed - 10/18/2022  9:06 AM      Passed - Valid encounter within last 12 months    Recent Outpatient Visits           8 months ago Essential hypertension   [facility] [place], Salvadore Oxford, NP   8 months ago Hypertensive emergency   [facility] [place], Salvadore Oxford, NP

## 2022-10-18 NOTE — Telephone Encounter (Signed)
Requested Prescriptions  Pending Prescriptions Disp Refills   rosuvastatin (CRESTOR) 40 MG tablet [Pharmacy Med Name: ROSUVASTATIN 40MG  TABLETS] 90 tablet 0    Sig: TAKE 1 TABLET(40 MG) BY MOUTH DAILY     Cardiovascular:  Antilipid - Statins 2 Failed - 10/18/2022  9:06 AM      Failed - Lipid Panel in normal range within the last 12 months    Cholesterol  Date Value Ref Range Status  12/23/2020 172 0 - 200 mg/dL Final    Comment:    ATP III Classification       Desirable:  < 200 mg/dL               Borderline High:  200 - 239 mg/dL          High:  > = 240 mg/dL   LDL Cholesterol  Date Value Ref Range Status  12/04/2019 218 (H) 0 - 99 mg/dL Final   Direct LDL  Date Value Ref Range Status  12/23/2020 114.0 mg/dL Final    Comment:    Optimal:  <100 mg/dLNear or Above Optimal:  100-129 mg/dLBorderline High:  130-159 mg/dLHigh:  160-189 mg/dLVery High:  >190 mg/dL   HDL  Date Value Ref Range Status  12/23/2020 39.10 >39.00 mg/dL Final   Triglycerides  Date Value Ref Range Status  12/23/2020 297.0 (H) 0.0 - 149.0 mg/dL Final    Comment:    Normal:  <150 mg/dLBorderline High:  150 - 199 mg/dL         Passed - Cr in normal range and within 360 days    Creat  Date Value Ref Range Status  10/15/2016 0.88 0.70 - 1.25 mg/dL Final    Comment:      For patients > or = 66 years of age: The upper reference limit for Creatinine is approximately 13% higher for people identified as African-American.      Creatinine, Ser  Date Value Ref Range Status  01/20/2022 0.72 0.61 - 1.24 mg/dL Final         Passed - Patient is not pregnant      Passed - Valid encounter within last 12 months    Recent Outpatient Visits           8 months ago Essential hypertension   [place], Coralie Keens, NP   8 months ago Hypertensive emergency   [facility] [place], [place] W, NP               pantoprazole ([place]) 40 MG tablet [Pharmacy Med Name:  PANTOPRAZOLE 40MG  TABLETS] 90 tablet 1    Sig: TAKE 1 TABLET(40 MG) BY MOUTH DAILY     Gastroenterology: Proton Pump Inhibitors Passed - 10/18/2022  9:06 AM      Passed - Valid encounter within last 12 months    Recent Outpatient Visits           8 months ago Essential hypertension   [place], Coralie Keens, NP   8 months ago Hypertensive emergency   [facility] [place], Coralie Keens, NP               ezetimibe (ZETIA) 10 MG tablet [Pharmacy Med Name: EZETIMIBE 10MG  TABLETS] 90 tablet 0    Sig: TAKE 1 TABLET(10 MG) BY MOUTH DAILY     Cardiovascular:  Antilipid - Sterol Transport Inhibitors Failed - 10/18/2022  9:06 AM      Failed - Lipid Panel in  normal range within the last 12 months    Cholesterol  Date Value Ref Range Status  12/23/2020 172 0 - 200 mg/dL Final    Comment:    ATP III Classification       Desirable:  < 200 mg/dL               Borderline High:  200 - 239 mg/dL          High:  > = 876 mg/dL   LDL Cholesterol  Date Value Ref Range Status  12/04/2019 218 (H) 0 - 99 mg/dL Final   Direct LDL  Date Value Ref Range Status  12/23/2020 114.0 mg/dL Final    Comment:    Optimal:  <100 mg/dLNear or Above Optimal:  100-129 mg/dLBorderline High:  130-159 mg/dLHigh:  160-189 mg/dLVery High:  >190 mg/dL   HDL  Date Value Ref Range Status  12/23/2020 39.10 >39.00 mg/dL Final   Triglycerides  Date Value Ref Range Status  12/23/2020 297.0 (H) 0.0 - 149.0 mg/dL Final    Comment:    Normal:  <150 mg/dLBorderline High:  150 - 199 mg/dL         Passed - AST in normal range and within 360 days    AST  Date Value Ref Range Status  01/19/2022 24 15 - 41 U/L Final         Passed - ALT in normal range and within 360 days    ALT  Date Value Ref Range Status  01/19/2022 25 0 - 44 U/L Final         Passed - Patient is not pregnant      Passed - Valid encounter within last 12 months    Recent Outpatient Visits           8 months ago  Essential hypertension   [facility] [place], Salvadore Oxford, NP   8 months ago Hypertensive emergency   [facility] [place], Salvadore Oxford, NP

## 2022-11-02 ENCOUNTER — Telehealth: Payer: Self-pay | Admitting: Internal Medicine

## 2022-11-02 NOTE — Telephone Encounter (Signed)
Pt came in the office today 11/02/2022 did not remember r/s'ing his appointment to 11/09/2022 and now he has r/s'd to November 15, 2022 @ 2:40 and he is in need of medication.  Rx:  rosuvastatin (CRESTOR) 40 MG  pt is out of this medication.  Pharm:  [facility] in [place] on [address]

## 2022-11-03 MED ORDER — ROSUVASTATIN CALCIUM 40 MG PO TABS: 40.0000 mg | ORAL_TABLET | Freq: Every day | ORAL | 0 refills | Status: DC

## 2022-11-03 NOTE — Addendum Note (Signed)
Addended by: Lorre Munroe on: 11/03/2022 02:18 PM   Modules accepted: Orders

## 2022-11-09 ENCOUNTER — Ambulatory Visit: Payer: BC Managed Care – PPO | Admitting: Internal Medicine

## 2022-11-15 ENCOUNTER — Encounter: Payer: Self-pay | Admitting: Internal Medicine

## 2022-11-15 ENCOUNTER — Ambulatory Visit (INDEPENDENT_AMBULATORY_CARE_PROVIDER_SITE_OTHER): Payer: BC Managed Care – PPO | Admitting: Internal Medicine

## 2022-11-15 VITALS — BP 146/96 | HR 65 | Temp 96.8°F | Ht 67.0 in | Wt 184.0 lb

## 2022-11-15 DIAGNOSIS — Z125 Encounter for screening for malignant neoplasm of prostate: Secondary | ICD-10-CM | POA: Diagnosis not present

## 2022-11-15 DIAGNOSIS — Z0001 Encounter for general adult medical examination with abnormal findings: Secondary | ICD-10-CM

## 2022-11-15 DIAGNOSIS — E663 Overweight: Secondary | ICD-10-CM

## 2022-11-15 DIAGNOSIS — L989 Disorder of the skin and subcutaneous tissue, unspecified: Secondary | ICD-10-CM

## 2022-11-15 DIAGNOSIS — Z23 Encounter for immunization: Secondary | ICD-10-CM | POA: Diagnosis not present

## 2022-11-15 DIAGNOSIS — R7309 Other abnormal glucose: Secondary | ICD-10-CM | POA: Diagnosis not present

## 2022-11-15 DIAGNOSIS — Z6828 Body mass index (BMI) 28.0-28.9, adult: Secondary | ICD-10-CM

## 2022-11-15 MED ORDER — EZETIMIBE 10 MG PO TABS: ORAL_TABLET | ORAL | 1 refills | Status: DC

## 2022-11-15 MED ORDER — AMLODIPINE BESYLATE 10 MG PO TABS: ORAL_TABLET | ORAL | 1 refills | Status: DC

## 2022-11-15 NOTE — Patient Instructions (Signed)
Health Maintenance, Male Adopting a healthy lifestyle and getting preventive care are important in promoting health and wellness. Ask your health care provider about: The right schedule for you to have regular tests and exams. Things you can do on your own to prevent diseases and keep yourself healthy. What should I know about diet, weight, and exercise? Eat a healthy diet  Eat a diet that includes plenty of vegetables, fruits, low-fat dairy products, and lean protein. Do not eat a lot of foods that are high in solid fats, added sugars, or sodium. Maintain a healthy weight Body mass index (BMI) is a measurement that can be used to identify possible weight problems. It estimates body fat based on height and weight. Your health care provider can help determine your BMI and help you achieve or maintain a healthy weight. Get regular exercise Get regular exercise. This is one of the most important things you can do for your health. Most adults should: Exercise for at least 150 minutes each week. The exercise should increase your heart rate and make you sweat (moderate-intensity exercise). Do strengthening exercises at least twice a week. This is in addition to the moderate-intensity exercise. Spend less time sitting. Even light physical activity can be beneficial. Watch cholesterol and blood lipids Have your blood tested for lipids and cholesterol at 66 years of age, then have this test every 5 years. You may need to have your cholesterol levels checked more often if: Your lipid or cholesterol levels are high. You are older than 66 years of age. You are at high risk for heart disease. What should I know about cancer screening? Many types of cancers can be detected early and may often be prevented. Depending on your health history and family history, you may need to have cancer screening at various ages. This may include screening for: Colorectal cancer. Prostate cancer. Skin cancer. Lung  cancer. What should I know about heart disease, diabetes, and high blood pressure? Blood pressure and heart disease High blood pressure causes heart disease and increases the risk of stroke. This is more likely to develop in people who have high blood pressure readings or are overweight. Talk with your health care provider about your target blood pressure readings. Have your blood pressure checked: Every 3-5 years if you are 18-39 years of age. Every year if you are 40 years old or older. If you are between the ages of 65 and 75 and are a current or former smoker, ask your health care provider if you should have a one-time screening for abdominal aortic aneurysm (AAA). Diabetes Have regular diabetes screenings. This checks your fasting blood sugar level. Have the screening done: Once every three years after age 45 if you are at a normal weight and have a low risk for diabetes. More often and at a younger age if you are overweight or have a high risk for diabetes. What should I know about preventing infection? Hepatitis B If you have a higher risk for hepatitis B, you should be screened for this virus. Talk with your health care provider to find out if you are at risk for hepatitis B infection. Hepatitis C Blood testing is recommended for: Everyone born from 1945 through 1965. Anyone with known risk factors for hepatitis C. Sexually transmitted infections (STIs) You should be screened each year for STIs, including gonorrhea and chlamydia, if: You are sexually active and are younger than 66 years of age. You are older than 66 years of age and your   health care provider tells you that you are at risk for this type of infection. Your sexual activity has changed since you were last screened, and you are at increased risk for chlamydia or gonorrhea. Ask your health care provider if you are at risk. Ask your health care provider about whether you are at high risk for HIV. Your health care provider  may recommend a prescription medicine to help prevent HIV infection. If you choose to take medicine to prevent HIV, you should first get tested for HIV. You should then be tested every 3 months for as long as you are taking the medicine. Follow these instructions at home: Alcohol use Do not drink alcohol if your health care provider tells you not to drink. If you drink alcohol: Limit how much you have to 0-2 drinks a day. Know how much alcohol is in your drink. In the [place] one drink equals one 12 oz bottle of beer (355 mL), one 5 oz glass of wine (148 mL), or one 1 oz glass of hard liquor (44 mL). Lifestyle Do not use any products that contain nicotine or tobacco. These products include cigarettes, chewing tobacco, and vaping devices, such as e-cigarettes. If you need help quitting, ask your health care provider. Do not use street drugs. Do not share needles. Ask your health care provider for help if you need support or information about quitting drugs. General instructions Schedule regular health, dental, and eye exams. Stay current with your vaccines. Tell your health care provider if: You often feel depressed. You have ever been abused or do not feel safe at home. Summary Adopting a healthy lifestyle and getting preventive care are important in promoting health and wellness. Follow your health care provider's instructions about healthy diet, exercising, and getting tested or screened for diseases. Follow your health care provider's instructions on monitoring your cholesterol and blood pressure. This information is not intended to replace advice given to you by your health care provider. Make sure you discuss any questions you have with your health care provider. Document Revised: 04/20/2021 Document Reviewed: 04/20/2021 Elsevier Patient Education  2023 Elsevier Inc.  

## 2022-11-15 NOTE — Progress Notes (Signed)
Subjective:    Patient ID: Thomas Farley, male    DOB: [birth year], 66 y.o.   MRN: [id number]  HPI  Patient presents to clinic today for his annual exam.  Of note, his BP today is 146/92.  He is taking amlodipine as prescribed.  He reports he did consume more caffeine today than he typically does.  Flu: Never Tetanus: >10 years ago COVID: Moderna x3 Pneumovax: Never Prevnar: Never Shingrix: Never PSA screening: 12/2020 Colon screening: > 10 years Vision screening: annually Dentist: annually  Diet: He does eat meat. He consumes fruits and veggies. He tries to avoid fried foods. He drinks mostly water, soda. Exercise: Walking  Review of Systems     Past Medical History:  Diagnosis Date   Hyperlipidemia    Hypertension     Current Outpatient Medications  Medication Sig Dispense Refill   amLODipine (NORVASC) 10 MG tablet TAKE 1 TABLET(10 MG) BY MOUTH DAILY 90 tablet 0   aspirin 81 MG tablet Take 81 mg by mouth daily.     ezetimibe (ZETIA) 10 MG tablet TAKE 1 TABLET(10 MG) BY MOUTH DAILY 90 tablet 0   folic acid (FOLVITE) 1 MG tablet Take 1 tablet (1 mg total) by mouth daily. 30 tablet 1   pantoprazole (PROTONIX) 40 MG tablet TAKE 1 TABLET(40 MG) BY MOUTH DAILY 90 tablet 1   rosuvastatin (CRESTOR) 40 MG tablet Take 1 tablet (40 mg total) by mouth daily. 90 tablet 0   No current facility-administered medications for this visit.    No Known Allergies  Family History  Problem Relation Age of Onset   Alzheimer's disease Mother    Prostate cancer Father    Hyperlipidemia Father    Heart disease Father    Alzheimer's disease Father     Social History   Socioeconomic History   Marital status: Married    Spouse name: Not on file   Number of children: Not on file   Years of education: Not on file   Highest education level: Not on file  Occupational History   Not on file  Tobacco Use   Smoking status: Never   Smokeless tobacco: Never  Substance and Sexual  Activity   Alcohol use: Yes    Alcohol/week: 0.0 standard drinks of alcohol    Comment: up to 5 times weekly---beer or wine   Drug use: No   Sexual activity: Yes  Other Topics Concern   Not on file  Social History Narrative   Not on file   Social Determinants of Health   Financial Resource Strain: Not on file  Food Insecurity: Not on file  Transportation Needs: Not on file  Physical Activity: Not on file  Stress: Not on file  Social Connections: Not on file  Intimate Partner Violence: Not on file     Constitutional: Denies fever, malaise, fatigue, headache or abrupt weight changes.  HEENT: Denies eye pain, eye redness, ear pain, ringing in the ears, wax buildup, runny nose, nasal congestion, bloody nose, or sore throat. Respiratory: Denies difficulty breathing, shortness of breath, cough or sputum production.   Cardiovascular: Denies chest pain, chest tightness, palpitations or swelling in the hands or feet.  Gastrointestinal: Denies abdominal pain, bloating, constipation, diarrhea or blood in the stool.  GU: Denies urgency, frequency, pain with urination, burning sensation, blood in urine, odor or discharge. Musculoskeletal: Denies decrease in range of motion, difficulty with gait, muscle pain or joint pain and swelling.  Skin: Denies redness, rashes, lesions or ulcercations.  Neurological: Denies dizziness, difficulty with memory, difficulty with speech or problems with balance and coordination.  Psych: Denies anxiety, depression, SI/HI.  No other specific complaints in a complete review of systems (except as listed in HPI above).  Objective:   Physical Exam  BP (!) 146/96 (BP Location: Right Arm, Patient Position: Sitting, Cuff Size: Normal)   Pulse 65   Temp (!) 96.8 F (36 C) (Temporal)   Ht _0  (1.702 m)   Wt 184 lb (83.5 kg)   SpO2 99%   BMI 28.82 kg/m   Wt Readings from Last 3 Encounters:  02/16/22 188 lb (85.3 kg)  01/27/22 190 lb (86.2 kg)  01/19/22 192  lb 14.4 oz (87.5 kg)    General: Appears her stated age, overweight, in NAD. Skin: Warm, dry and intact.  Patient has a scaly irregular shaped skin lesion to the right side of his face. HEENT: Head: normal shape and size; Eyes: sclera white, no icterus, conjunctiva pink, PERRLA and EOMs intact;  Neck:  Neck supple, trachea midline. No masses, lumps or thyromegaly present.  Cardiovascular: Normal rate and rhythm. S1,S2 noted.  No murmur, rubs or gallops noted. No JVD or BLE edema. No carotid bruits noted. Pulmonary/Chest: Normal effort and positive vesicular breath sounds. No respiratory distress. No wheezes, rales or ronchi noted.  Abdomen: Normal bowel sounds.  Musculoskeletal: Strength 5/5 BUE/BLE. No difficulty with gait.  Neurological: Alert and oriented. Cranial nerves II-XII grossly intact. Coordination normal.  Psychiatric: Mood and affect normal. Behavior is normal. Judgment and thought content normal.    BMET    Component Value Date/Time   NA 139 01/20/2022 0550   K 4.0 01/20/2022 0550   CL 106 01/20/2022 0550   CO2 28 01/20/2022 0550   GLUCOSE 111 (H) 01/20/2022 0550   BUN 18 01/20/2022 0550   CREATININE 0.72 01/20/2022 0550   CREATININE 0.88 10/15/2016 1653   CALCIUM 9.4 01/20/2022 0550   GFRNONAA >60 01/20/2022 0550    Lipid Panel     Component Value Date/Time   CHOL 172 12/23/2020 1539   TRIG 297.0 (H) 12/23/2020 1539   HDL 39.10 12/23/2020 1539   CHOLHDL 4 12/23/2020 1539   VLDL 59.4 (H) 12/23/2020 1539   LDLCALC 218 (H) 12/04/2019 1437    CBC    Component Value Date/Time   WBC 8.3 01/20/2022 0550   RBC 5.12 01/20/2022 0550   HGB 15.7 01/20/2022 0550   HCT 47.0 01/20/2022 0550   PLT 178 01/20/2022 0550   MCV 91.8 01/20/2022 0550   MCH 30.7 01/20/2022 0550   MCHC 33.4 01/20/2022 0550   RDW 13.4 01/20/2022 0550   LYMPHSABS 2,047 10/15/2016 1653   MONOABS 712 10/15/2016 1653   EOSABS 890 (H) 10/15/2016 1653   BASOSABS 0 10/15/2016 1653    Hgb  A1C No results found for: "HGBA1C"         Assessment & Plan:    Preventative Health Maintenance:  He declines flu shot He declines tetanus vaccine  Encouraged him to get his COVID booster Prevnar 20 today, he will not need pneumovax He declines Shingrix He already has a referral to GI for colonoscopy, he will call to schedule Encouraged him to consume a balanced diet and exercise regimen Advised him to see an eye doctor and dentist annually We will check CBC, c-Met, lipid, PSA and A1c today  Skin Lesion of Face:  Referral to dermatology for further evaluation and treatment  RTC in 6 months, follow-up chronic conditions Rollene Fare  Garnette Gunner, NP

## 2022-11-15 NOTE — Assessment & Plan Note (Signed)
Encouraged diet and exercise for weight loss ?

## 2022-11-16 LAB — COMPLETE METABOLIC PANEL WITH GFR
AG Ratio: 1.7 (calc) (ref 1.0–2.5)
ALT: 25 U/L (ref 9–46)
AST: 21 U/L (ref 10–35)
Albumin: 4.8 g/dL (ref 3.6–5.1)
Alkaline phosphatase (APISO): 79 U/L (ref 35–144)
BUN: 16 mg/dL (ref 7–25)
CO2: 25 mmol/L (ref 20–32)
Calcium: 9.7 mg/dL (ref 8.6–10.3)
Chloride: 104 mmol/L (ref 98–110)
Creat: 0.84 mg/dL (ref 0.70–1.35)
Globulin: 2.8 g/dL (calc) (ref 1.9–3.7)
Glucose, Bld: 93 mg/dL (ref 65–99)
Potassium: 4.2 mmol/L (ref 3.5–5.3)
Sodium: 140 mmol/L (ref 135–146)
Total Bilirubin: 0.5 mg/dL (ref 0.2–1.2)
Total Protein: 7.6 g/dL (ref 6.1–8.1)
eGFR: 96 mL/min/{1.73_m2} (ref >=60)

## 2022-11-16 LAB — CBC
HCT: 51.9 % — ABNORMAL HIGH (ref 38.5–50.0)
Hemoglobin: 17.3 g/dL — ABNORMAL HIGH (ref 13.2–17.1)
MCH: 31.1 pg (ref 27.0–33.0)
MCHC: 33.3 g/dL (ref 32.0–36.0)
MCV: 93.2 fL (ref 80.0–100.0)
MPV: 12.5 fL (ref 7.5–12.5)
Platelets: 215 10*3/uL (ref 140–400)
RBC: 5.57 10*6/uL (ref 4.20–5.80)
RDW: 12.9 % (ref 11.0–15.0)
WBC: 10.2 10*3/uL (ref 3.8–10.8)

## 2022-11-16 LAB — HEMOGLOBIN A1C
Hgb A1c MFr Bld: 6.1 % of total Hgb — ABNORMAL HIGH (ref <5.7)
Mean Plasma Glucose: 128 mg/dL
eAG (mmol/L): 7.1 mmol/L

## 2022-11-16 LAB — LIPID PANEL
Cholesterol: 165 mg/dL (ref <200)
HDL: 63 mg/dL (ref >=40)
LDL Cholesterol (Calc): 78 mg/dL (calc)
Non-HDL Cholesterol (Calc): 102 mg/dL (calc) (ref <130)
Total CHOL/HDL Ratio: 2.6 (calc) (ref <5.0)
Triglycerides: 143 mg/dL (ref <150)

## 2022-11-16 LAB — PSA: PSA: 1.08 ng/mL (ref <=4.00)

## 2023-01-29 ENCOUNTER — Other Ambulatory Visit: Payer: Self-pay | Admitting: Internal Medicine

## 2023-01-31 NOTE — Telephone Encounter (Signed)
Requested Prescriptions  Pending Prescriptions Disp Refills   pantoprazole (PROTONIX) 40 MG tablet [Pharmacy Med Name: PANTOPRAZOLE 40MG TABLETS] 90 tablet 1    Sig: TAKE 1 TABLET(40 MG) BY MOUTH DAILY     Gastroenterology: Proton Pump Inhibitors Passed - 01/29/2023  6:32 AM      Passed - Valid encounter within last 12 months    Recent Outpatient Visits           2 months ago Encounter for general adult medical examination with abnormal findings   [facility] [place], Coralie Keens, NP   11 months ago Essential hypertension   [facility] [place], Coralie Keens, NP   1 year ago Hypertensive emergency   [facility] [place], Coralie Keens, NP       Future Appointments             In 8 months Ralene Bathe, MD [place]             rosuvastatin ([place]) 40 MG tablet [Pharmacy Med Name: ROSUVASTATIN 40MG TABLETS] 90 tablet 3    Sig: TAKE 1 TABLET(40 MG) BY MOUTH DAILY     Cardiovascular:  Antilipid - Statins 2 Failed - 01/29/2023  6:32 AM      Failed - Lipid Panel in normal range within the last 12 months    Cholesterol  Date Value Ref Range Status  11/15/2022 165 <200 mg/dL Final   LDL Cholesterol (Calc)  Date Value Ref Range Status  11/15/2022 78 mg/dL (calc) Final    Comment:    Reference range: <100 . Desirable range <100 mg/dL for primary prevention;   <70 mg/dL for patients with CHD or diabetic patients  with > or = 2 CHD risk factors. Marland Kitchen LDL-C is now calculated using the Martin-Hopkins  calculation, which is a validated novel method providing  better accuracy than the Friedewald equation in the  estimation of LDL-C.  Cresenciano Genre et al. Annamaria Helling. WG:[id number]): 2061-2068  (http://education.[employer].com/faq/FAQ164)    Direct LDL  Date Value Ref Range Status  12/23/2020 114.0 mg/dL Final    Comment:    Optimal:  <100 mg/dLNear or Above Optimal:  100-129 mg/dLBorderline  High:  130-159 mg/dLHigh:  160-189 mg/dLVery High:  >190 mg/dL   HDL  Date Value Ref Range Status  11/15/2022 63 > OR = 40 mg/dL Final   Triglycerides  Date Value Ref Range Status  11/15/2022 143 <150 mg/dL Final         Passed - Cr in normal range and within 360 days    Creat  Date Value Ref Range Status  11/15/2022 0.84 0.70 - 1.35 mg/dL Final         Passed - Patient is not pregnant      Passed - Valid encounter within last 12 months    Recent Outpatient Visits           2 months ago Encounter for general adult medical examination with abnormal findings   [facility] [place], Coralie Keens, NP   11 months ago Essential hypertension   [facility] [place], Coralie Keens, NP   1 year ago Hypertensive emergency   [facility] [place], Coralie Keens, NP       Future Appointments             In 8 months Ralene Bathe, MD [place]

## 2023-04-29 ENCOUNTER — Other Ambulatory Visit: Payer: Self-pay | Admitting: Internal Medicine

## 2023-04-29 NOTE — Telephone Encounter (Signed)
Unable to refill per protocol, Rx request is too soon. Last refill 11/15/22 for 90 and 1 refill.  Requested Prescriptions  Pending Prescriptions Disp Refills   ezetimibe (ZETIA) 10 MG tablet [Pharmacy Med Name: EZETIMIBE 10MG  TABLETS] 90 tablet 1    Sig: TAKE 1 TABLET(10 MG) BY MOUTH DAILY     Cardiovascular:  Antilipid - Sterol Transport Inhibitors Failed - 04/29/2023  6:23 AM      Failed - Lipid Panel in normal range within the last 12 months    Cholesterol  Date Value Ref Range Status  11/15/2022 165 <200 mg/dL Final   LDL Cholesterol (Calc)  Date Value Ref Range Status  11/15/2022 78 mg/dL (calc) Final    Comment:    Reference range: <100 . Desirable range <100 mg/dL for primary prevention;   <70 mg/dL for patients with CHD or diabetic patients  with > or = 2 CHD risk factors. Marland Kitchen LDL-C is now calculated using the Martin-Hopkins  calculation, which is a validated novel method providing  better accuracy than the Friedewald equation in the  estimation of LDL-C.  Horald Pollen et al. Lenox Ahr. 9604;540(98): 2061-2068  (http://education.[facility].com/faq/FAQ164)    Direct LDL  Date Value Ref Range Status  12/23/2020 114.0 mg/dL Final    Comment:    Optimal:  <100 mg/dLNear or Above Optimal:  100-129 mg/dLBorderline High:  130-159 mg/dLHigh:  160-189 mg/dLVery High:  >190 mg/dL   HDL  Date Value Ref Range Status  11/15/2022 63 > OR = 40 mg/dL Final   Triglycerides  Date Value Ref Range Status  11/15/2022 143 <150 mg/dL Final         Passed - AST in normal range and within 360 days    AST  Date Value Ref Range Status  11/15/2022 21 10 - 35 U/L Final         Passed - ALT in normal range and within 360 days    ALT  Date Value Ref Range Status  11/15/2022 25 9 - 46 U/L Final         Passed - Patient is not pregnant      Passed - Valid encounter within last 12 months    Recent Outpatient Visits           5 months ago Encounter for general adult medical  examination with abnormal findings   [facility] [facility] [place], Salvadore Oxford, NP   1 year ago Essential hypertension   [place] [facility] [place], Salvadore Oxford, NP   1 year ago Hypertensive emergency   [place] [facility] [place], Salvadore Oxford, NP       Future Appointments             In 5 months Deirdre Evener, MD [facility]

## 2023-05-05 ENCOUNTER — Other Ambulatory Visit: Payer: Self-pay | Admitting: Internal Medicine

## 2023-05-05 NOTE — Telephone Encounter (Signed)
Requested by interface surescripts.  Requested Prescriptions  Pending Prescriptions Disp Refills   pantoprazole (PROTONIX) 40 MG tablet [Pharmacy Med Name: PANTOPRAZOLE 40MG  TABLETS] 90 tablet 1    Sig: TAKE 1 TABLET(40 MG) BY MOUTH DAILY     Gastroenterology: Proton Pump Inhibitors Passed - 05/05/2023  3:41 PM      Passed - Valid encounter within last 12 months    Recent Outpatient Visits           5 months ago Encounter for general adult medical examination with abnormal findings   [place] [facility] [place], Salvadore Oxford, NP   1 year ago Essential hypertension   [place] [facility] [place], Salvadore Oxford, NP   1 year ago Hypertensive emergency   [place] [facility] [place], Salvadore Oxford, NP       Future Appointments             In 5 months Deirdre Evener, MD [facility]

## 2023-05-25 DIAGNOSIS — H43813 Vitreous degeneration, bilateral: Secondary | ICD-10-CM | POA: Diagnosis not present

## 2023-05-25 DIAGNOSIS — H2512 Age-related nuclear cataract, left eye: Secondary | ICD-10-CM | POA: Diagnosis not present

## 2023-05-25 DIAGNOSIS — H2511 Age-related nuclear cataract, right eye: Secondary | ICD-10-CM | POA: Diagnosis not present

## 2023-05-25 DIAGNOSIS — G43109 Migraine with aura, not intractable, without status migrainosus: Secondary | ICD-10-CM | POA: Diagnosis not present

## 2023-05-25 DIAGNOSIS — H2513 Age-related nuclear cataract, bilateral: Secondary | ICD-10-CM | POA: Diagnosis not present

## 2023-06-03 ENCOUNTER — Other Ambulatory Visit: Payer: Self-pay | Admitting: Internal Medicine

## 2023-06-03 NOTE — Telephone Encounter (Signed)
Requested Prescriptions  Pending Prescriptions Disp Refills   ezetimibe (ZETIA) 10 MG tablet [Pharmacy Med Name: EZETIMIBE 10MG  TABLETS] 90 tablet 0    Sig: TAKE 1 TABLET(10 MG) BY MOUTH DAILY     Cardiovascular:  Antilipid - Sterol Transport Inhibitors Failed - 06/03/2023  9:38 AM      Failed - Lipid Panel in normal range within the last 12 months    Cholesterol  Date Value Ref Range Status  11/15/2022 165 <200 mg/dL Final   LDL Cholesterol (Calc)  Date Value Ref Range Status  11/15/2022 78 mg/dL (calc) Final    Comment:    Reference range: <100 . Desirable range <100 mg/dL for primary prevention;   <70 mg/dL for patients with CHD or diabetic patients  with > or = 2 CHD risk factors. Marland Kitchen LDL-C is now calculated using the Martin-Hopkins  calculation, which is a validated novel method providing  better accuracy than the Friedewald equation in the  estimation of LDL-C.  Horald Pollen et al. Lenox Ahr. 9604;540(98): 2061-2068  (http://education.[facility].com/faq/FAQ164)    Direct LDL  Date Value Ref Range Status  12/23/2020 114.0 mg/dL Final    Comment:    Optimal:  <100 mg/dLNear or Above Optimal:  100-129 mg/dLBorderline High:  130-159 mg/dLHigh:  160-189 mg/dLVery High:  >190 mg/dL   HDL  Date Value Ref Range Status  11/15/2022 63 > OR = 40 mg/dL Final   Triglycerides  Date Value Ref Range Status  11/15/2022 143 <150 mg/dL Final         Passed - AST in normal range and within 360 days    AST  Date Value Ref Range Status  11/15/2022 21 10 - 35 U/L Final         Passed - ALT in normal range and within 360 days    ALT  Date Value Ref Range Status  11/15/2022 25 9 - 46 U/L Final         Passed - Patient is not pregnant      Passed - Valid encounter within last 12 months    Recent Outpatient Visits           6 months ago Encounter for general adult medical examination with abnormal findings   [place] [facility] [place], Salvadore Oxford, NP   1 year  ago Essential hypertension   [facility] [place], Salvadore Oxford, NP   1 year ago Hypertensive emergency   [place] [facility] [place], Salvadore Oxford, NP       Future Appointments             In 4 months Deirdre Evener, MD [facility]

## 2023-07-01 ENCOUNTER — Other Ambulatory Visit: Payer: Self-pay | Admitting: Internal Medicine

## 2023-07-01 NOTE — Telephone Encounter (Signed)
Courtesy refill. Patient will need an office visit for further refills. Requested Prescriptions  Pending Prescriptions Disp Refills   amLODipine (NORVASC) 10 MG tablet [Pharmacy Med Name: AMLODIPINE BESYLATE 10MG  TABLETS] 90 tablet 1    Sig: TAKE 1 TABLET(10 MG) BY MOUTH DAILY     Cardiovascular: Calcium Channel Blockers 2 Failed - 07/01/2023  6:16 AM      Failed - Last BP in normal range    BP Readings from Last 1 Encounters:  11/15/22 (!) 146/96         Failed - Valid encounter within last 6 months    Recent Outpatient Visits           7 months ago Encounter for general adult medical examination with abnormal findings   [place] [facility] [place], Salvadore Oxford, NP   1 year ago Essential hypertension   [place] [facility] [place], Salvadore Oxford, NP   1 year ago Hypertensive emergency   [facility] [place], Salvadore Oxford, NP       Future Appointments             In 3 months Deirdre Evener, MD [place] [facility]            Passed - Last Heart Rate in normal range    Pulse Readings from Last 1 Encounters:  11/15/22 65

## 2023-07-01 NOTE — Telephone Encounter (Signed)
Called pt - Pt was in car driving and will call back to schedule ov.

## 2023-07-20 ENCOUNTER — Telehealth: Payer: Self-pay

## 2023-07-20 NOTE — Telephone Encounter (Signed)
Okay. Thank you.

## 2023-07-20 NOTE — Telephone Encounter (Signed)
Patient called to schedule colonoscopy.

## 2023-07-20 NOTE — Telephone Encounter (Signed)
I called the patient to inform him that we gone need a new referral.

## 2023-07-22 ENCOUNTER — Encounter: Payer: Self-pay | Admitting: Internal Medicine

## 2023-07-22 ENCOUNTER — Telehealth: Payer: BC Managed Care – PPO | Admitting: Internal Medicine

## 2023-07-22 DIAGNOSIS — K2100 Gastro-esophageal reflux disease with esophagitis, without bleeding: Secondary | ICD-10-CM

## 2023-07-22 DIAGNOSIS — R7303 Prediabetes: Secondary | ICD-10-CM

## 2023-07-22 DIAGNOSIS — E663 Overweight: Secondary | ICD-10-CM

## 2023-07-22 DIAGNOSIS — I10 Essential (primary) hypertension: Secondary | ICD-10-CM | POA: Diagnosis not present

## 2023-07-22 DIAGNOSIS — D751 Secondary polycythemia: Secondary | ICD-10-CM

## 2023-07-22 DIAGNOSIS — E7800 Pure hypercholesterolemia, unspecified: Secondary | ICD-10-CM

## 2023-07-22 DIAGNOSIS — Z6828 Body mass index (BMI) 28.0-28.9, adult: Secondary | ICD-10-CM

## 2023-07-22 MED ORDER — PANTOPRAZOLE SODIUM 20 MG PO TBEC: 20.0000 mg | DELAYED_RELEASE_TABLET | Freq: Every day | ORAL | 1 refills | Status: DC

## 2023-07-22 NOTE — Assessment & Plan Note (Signed)
Encouraged low carb diet and exercise for weight loss

## 2023-07-22 NOTE — Progress Notes (Signed)
Virtual Visit via Video Note  I connected with Thomas Farley on 07/22/23 at  9:00 AM EDT by a video enabled telemedicine application and verified that I am speaking with the correct person using two identifiers.  Location: Patient: Work Provider: Home  Persons participating in this video call: Nicki Reaper, NP and Granville Lewis   I discussed the limitations of evaluation and management by telemedicine and the availability of in person appointments. The patient expressed understanding and agreed to proceed.  History of Present Illness:  Patient due for follow-up of chronic conditions.  HTN: He does not check his BP at home.  He is taking amlodipine as prescribed.  ECG from 01/2022 reviewed.  HLD: His last LDL was 78, triglycerides 161, 11/2022.  He denies myalgias on rosuvastatin, ezetimibe.  He tries to consume a low-fat diet.  GERD: He is not sure what triggers this. He denies breakthrough on pantoprazole. He has never tried to wean this medication. There is no upper GI on file.   Polycythemia: His last H/H was 17.3/51.9, 11/2022.  He does not smoke. He has no family history of blood clotting disorders. He does not follow with hematology.  Prediabetes: His last A1c was 6.1%, 11/2022.  He is not taking any oral diabetic medication at this time.  He does not check his sugars.   Past Medical History:  Diagnosis Date   Hyperlipidemia    Hypertension     Current Outpatient Medications  Medication Sig Dispense Refill   amLODipine (NORVASC) 10 MG tablet TAKE 1 TABLET(10 MG) BY MOUTH DAILY 30 tablet 0   aspirin 81 MG tablet Take 81 mg by mouth daily.     ezetimibe (ZETIA) 10 MG tablet TAKE 1 TABLET(10 MG) BY MOUTH DAILY 90 tablet 0   folic acid (FOLVITE) 1 MG tablet Take 1 tablet (1 mg total) by mouth daily. 30 tablet 1   pantoprazole (PROTONIX) 40 MG tablet TAKE 1 TABLET(40 MG) BY MOUTH DAILY 90 tablet 1   rosuvastatin (CRESTOR) 40 MG tablet TAKE 1 TABLET(40 MG) BY MOUTH DAILY 90  tablet 3   No current facility-administered medications for this visit.    No Known Allergies  Family History  Problem Relation Age of Onset   Alzheimer's disease Mother    Prostate cancer Father    Hyperlipidemia Father    Heart disease Father    Alzheimer's disease Father     Social History   Socioeconomic History   Marital status: Married    Spouse name: Not on file   Number of children: Not on file   Years of education: Not on file   Highest education level: Not on file  Occupational History   Not on file  Tobacco Use   Smoking status: Never   Smokeless tobacco: Never  Vaping Use   Vaping status: Never Used  Substance and Sexual Activity   Alcohol use: Yes    Alcohol/week: 0.0 standard drinks of alcohol    Comment: up to 5 times weekly---beer or wine   Drug use: No   Sexual activity: Yes  Other Topics Concern   Not on file  Social History Narrative   Not on file   Social Determinants of Health   Financial Resource Strain: Not on file  Food Insecurity: Not on file  Transportation Needs: Not on file  Physical Activity: Not on file  Stress: Not on file  Social Connections: Not on file  Intimate Partner Violence: Not on file  Constitutional: Denies fever, malaise, fatigue, headache or abrupt weight changes.  HEENT: Denies eye pain, eye redness, ear pain, ringing in the ears, wax buildup, runny nose, nasal congestion, bloody nose, or sore throat. Respiratory: Denies difficulty breathing, shortness of breath, cough or sputum production.   Cardiovascular: Denies chest pain, chest tightness, palpitations or swelling in the hands or feet.  Gastrointestinal: Denies abdominal pain, bloating, constipation, diarrhea or blood in the stool.  GU: Denies urgency, frequency, pain with urination, burning sensation, blood in urine, odor or discharge. Musculoskeletal: Denies decrease in range of motion, difficulty with gait, muscle pain or joint pain and swelling.   Skin: Denies redness, rashes, lesions or ulcercations.  Neurological: Denies dizziness, difficulty with memory, difficulty with speech or problems with balance and coordination.  Psych: Denies anxiety, depression, SI/HI.  No other specific complaints in a complete review of systems (except as listed in HPI above).  Observations/Objective:   Wt Readings from Last 3 Encounters:  11/15/22 184 lb (83.5 kg)  02/16/22 188 lb (85.3 kg)  01/27/22 190 lb (86.2 kg)    General: Appears his stated age, overweight, in NAD. Pulmonary/Chest: Normal effort. No respiratory distress.  Neurological: Alert and oriented. Coordination normal.    BMET    Component Value Date/Time   NA 140 11/15/2022 1459   K 4.2 11/15/2022 1459   CL 104 11/15/2022 1459   CO2 25 11/15/2022 1459   GLUCOSE 93 11/15/2022 1459   BUN 16 11/15/2022 1459   CREATININE 0.84 11/15/2022 1459   CALCIUM 9.7 11/15/2022 1459   GFRNONAA >60 01/20/2022 0550    Lipid Panel     Component Value Date/Time   CHOL 165 11/15/2022 1459   TRIG 143 11/15/2022 1459   HDL 63 11/15/2022 1459   CHOLHDL 2.6 11/15/2022 1459   VLDL 59.4 (H) 12/23/2020 1539   LDLCALC 78 11/15/2022 1459    CBC    Component Value Date/Time   WBC 10.2 11/15/2022 1459   RBC 5.57 11/15/2022 1459   HGB 17.3 (H) 11/15/2022 1459   HCT 51.9 (H) 11/15/2022 1459   PLT 215 11/15/2022 1459   MCV 93.2 11/15/2022 1459   MCH 31.1 11/15/2022 1459   MCHC 33.3 11/15/2022 1459   RDW 12.9 11/15/2022 1459   LYMPHSABS 2,047 10/15/2016 1653   MONOABS 712 10/15/2016 1653   EOSABS 890 (H) 10/15/2016 1653   BASOSABS 0 10/15/2016 1653    Hgb A1C Lab Results  Component Value Date   HGBA1C 6.1 (H) 11/15/2022       Assessment and Plan:  RTC in 4 months for your annual exam  Follow Up Instructions:    I discussed the assessment and treatment plan with the patient. The patient was provided an opportunity to ask questions and all were answered. The patient  agreed with the plan and demonstrated an understanding of the instructions.   The patient was advised to call back or seek an in-person evaluation if the symptoms worsen or if the condition fails to improve as anticipated.    Nicki Reaper, NP

## 2023-07-22 NOTE — Patient Instructions (Signed)
Polycythemia Vera  Polycythemia vera (PV) is a form of blood cancer called a myeloproliferative neoplasm (MPN) in which the bone marrow makes too many red blood cells. The bone marrow may also make too many clotting cells (platelets) and white blood cells. Bone marrow is the spongy center of bones where blood cells are produced. Sometimes, this causes an overproduction of blood cells in the liver and spleen, causing those organs to become enlarged. Additionally, people who have PV are at a higher risk for stroke or heart attack because their blood may clot more easily. PV is a long-term, or chronic, disease. What are the causes? Almost all people who have PV have an abnormal gene (genetic mutation) that causes changes in the way that the bone marrow makes blood cells. This gene, which is called JAK2, is not hereditary. This means that it is not passed along from parent to child. It is not known what triggers the genetic mutation that causes the body to produce too many red blood cells. What increases the risk? You are more likely to develop this condition if: You are male. You are 67 years of age or older. What are the signs or symptoms? You may not have any symptoms in the early stage of PV. When symptoms develop, they may include: Itchy skin, especially after bathing. Burning sensation in the hands or feet. Abdominal fullness or feeling full soon after eating. Ulcers on fingers or toes. Shortness of breath. Bone or joint pain. Dizziness. Headache. Tiredness. Ringing in the ears. Weight loss. Unusual bleeding, including nosebleeds or bleeding from gums. How is this diagnosed? This condition may be diagnosed during a routine physical exam and a blood test called a complete blood count (CBC). Your health care provider may also suspect PV if you have symptoms. During the physical exam, your health care provider may find that you have an enlarged liver or spleen. You may also have tests to  confirm the diagnosis. These may include: A procedure to remove a sample of bone marrow for testing (bone marrow biopsy). Blood tests to check for: The JAK2 gene. Low levels of a hormone that helps to regulate red blood cell production (erythropoietin). How is this treated? There is no cure for PV, but treatment can help to control the disease. There are several types of treatment. No single treatment works for everyone. You will need to work with a blood cancer specialist (hematologist) to find the treatment that is best for you. This condition may be treated by: Periodically having some blood removed with a needle (phlebotomy) to lower the number of red blood cells. Taking medicine. Your health care provider may recommend: Low-dose aspirin to lower your risk for blood clots. A medicine to control blood counts. A medicine that slows down the effects of JAK2 gene. Other medicines to treat symptoms such as itching. Follow these instructions at home:  Take over-the-counter and prescription medicines only as told by your health care provider. Do regular exercise as told by your health care provider. Check your hands and feet regularly for any sores that do not heal. Do not use any products that contain nicotine or tobacco. These products include cigarettes, chewing tobacco, and vaping devices, such as e-cigarettes. If you need help quitting, ask your health care provider. Return to your normal activities as told by your health care provider. Ask your health care provider what activities are safe for you. Keep all follow-up visits. This is important. Contact a health care provider if: You have  side effects from your medicines. Your symptoms change or get worse at home. You have blood in your stool or you vomit blood. Get help right away if: You have sudden and severe pain in your abdomen. You have chest pain or difficulty breathing. You have any symptoms of a stroke. "BE FAST" is an easy way  to remember the main warning signs of a stroke: B - Balance. Signs are dizziness, sudden trouble walking, or loss of balance. E - Eyes. Signs are trouble seeing or a sudden change in vision. F - Face. Signs are sudden weakness or numbness of the face, or the face or eyelid drooping on one side. A - Arms. Signs are weakness or numbness in an arm. This happens suddenly and usually on one side of the body. S - Speech. Signs are sudden trouble speaking, slurred speech, or trouble understanding what people say. T - Time. Time to call emergency services. Write down what time symptoms started. You have other signs of a stroke, such as: A sudden, severe headache with no known cause. Nausea or vomiting. Seizure. These symptoms may be an emergency. Get help right away. Call 911. Do not wait to see if the symptoms will go away. Do not drive yourself to the hospital. Summary Polycythemia vera is a form of blood cancer in which the bone marrow makes too many red blood cells. People who have polycythemia vera are at a higher risk for stroke or heart attack because their blood may clot more easily. The disease is most often associated with an abnormal gene (genetic mutation) that causes changes in the way that the bone marrow makes blood cells. There is no cure for PV, but treatment can help to control the disease. It is treated by periodically having some blood removed and by taking medicines. This information is not intended to replace advice given to you by your health care provider. Make sure you discuss any questions you have with your health care provider. Document Revised: 11/24/2021 Document Reviewed: 11/24/2021 Elsevier Patient Education  2024 [employer].

## 2023-07-22 NOTE — Assessment & Plan Note (Signed)
Non-smoker We will check iron panel at annual exam to rule out hemochromatosis Encouraged monthly blood donation

## 2023-07-22 NOTE — Assessment & Plan Note (Signed)
Will decrease pantoprazole to 20 mg daily Avoid foods that trigger reflux Encourage weight loss as this can help reduce reflux symptoms

## 2023-07-22 NOTE — Assessment & Plan Note (Signed)
Will check lipid profile at annual exam Encourage low-fat diet Continue rosuvastatin and ezetimibe

## 2023-07-22 NOTE — Assessment & Plan Note (Signed)
Will check BP in office at next visit Reinforced DASH diet and exercise for weight loss Continue amlodipine

## 2023-07-22 NOTE — Assessment & Plan Note (Signed)
Encourage diet and exercise for weight loss 

## 2023-07-26 ENCOUNTER — Telehealth: Payer: Self-pay

## 2023-07-26 ENCOUNTER — Encounter: Payer: Self-pay | Admitting: Internal Medicine

## 2023-07-26 DIAGNOSIS — D751 Secondary polycythemia: Secondary | ICD-10-CM

## 2023-07-26 NOTE — Telephone Encounter (Signed)
Copied from CRM [id number]. Topic: General - Inquiry >> Jul 25, 2023  5:01 PM Haroldine Laws wrote: Reason for CRM: Dalbert Garnet a clinical nurse at [facility] [employer] called saying this patient is going to coming in or calling the office tomorrow about labs being redone from his employer.  Misty Stanley is involved in his care and feels they need to be retaken.  If you have any questions for her her number is [phone] or [phone]

## 2023-07-26 NOTE — Telephone Encounter (Signed)
Duplicate message.  Patient is going to upload results for Rene Kocher to review.

## 2023-07-26 NOTE — Telephone Encounter (Signed)
Copied from CRM [id number]. Topic: General - Other >> Jul 26, 2023  1:08 PM Ja-Kwan M wrote: Reason for CRM: Pt requests orders for labs.

## 2023-07-26 NOTE — Telephone Encounter (Signed)
Patient is going to upload results and will wait to see how Rene Kocher would like to proceed.

## 2023-07-28 ENCOUNTER — Other Ambulatory Visit: Payer: Self-pay | Admitting: Internal Medicine

## 2023-07-29 ENCOUNTER — Other Ambulatory Visit: Payer: BC Managed Care – PPO

## 2023-07-29 DIAGNOSIS — D751 Secondary polycythemia: Secondary | ICD-10-CM | POA: Diagnosis not present

## 2023-07-29 NOTE — Telephone Encounter (Signed)
Requested Prescriptions  Pending Prescriptions Disp Refills   ezetimibe (ZETIA) 10 MG tablet [Pharmacy Med Name: EZETIMIBE 10MG  TABLETS] 90 tablet 0    Sig: TAKE 1 TABLET(10 MG) BY MOUTH DAILY     Cardiovascular:  Antilipid - Sterol Transport Inhibitors Failed - 07/28/2023  6:21 AM      Failed - Lipid Panel in normal range within the last 12 months    Cholesterol  Date Value Ref Range Status  11/15/2022 165 <200 mg/dL Final   LDL Cholesterol (Calc)  Date Value Ref Range Status  11/15/2022 78 mg/dL (calc) Final    Comment:    Reference range: <100 . Desirable range <100 mg/dL for primary prevention;   <70 mg/dL for patients with CHD or diabetic patients  with > or = 2 CHD risk factors. Marland Kitchen LDL-C is now calculated using the Martin-Hopkins  calculation, which is a validated novel method providing  better accuracy than the Friedewald equation in the  estimation of LDL-C.  Horald Pollen et al. Lenox Ahr. 0630;160(10): 2061-2068  (http://education.[facility].com/faq/FAQ164)    Direct LDL  Date Value Ref Range Status  12/23/2020 114.0 mg/dL Final    Comment:    Optimal:  <100 mg/dLNear or Above Optimal:  100-129 mg/dLBorderline High:  130-159 mg/dLHigh:  160-189 mg/dLVery High:  >190 mg/dL   HDL  Date Value Ref Range Status  11/15/2022 63 > OR = 40 mg/dL Final   Triglycerides  Date Value Ref Range Status  11/15/2022 143 <150 mg/dL Final         Passed - AST in normal range and within 360 days    AST  Date Value Ref Range Status  11/15/2022 21 10 - 35 U/L Final         Passed - ALT in normal range and within 360 days    ALT  Date Value Ref Range Status  11/15/2022 25 9 - 46 U/L Final         Passed - Patient is not pregnant      Passed - Valid encounter within last 12 months    Recent Outpatient Visits           1 week ago Essential hypertension   [facility] [place], Salvadore Oxford, NP   8 months ago Encounter for general adult medical  examination with abnormal findings   [facility] [place], Salvadore Oxford, NP   1 year ago Essential hypertension   [facility] [place], Salvadore Oxford, NP   1 year ago Hypertensive emergency   [place] [facility] [place], Salvadore Oxford, NP       Future Appointments             In 2 months Deirdre Evener, MD [facility]

## 2023-07-31 ENCOUNTER — Other Ambulatory Visit: Payer: Self-pay | Admitting: Internal Medicine

## 2023-08-02 NOTE — Telephone Encounter (Signed)
Requested Prescriptions  Pending Prescriptions Disp Refills   amLODipine (NORVASC) 10 MG tablet [Pharmacy Med Name: AMLODIPINE BESYLATE 10MG  TABLETS] 30 tablet 3    Sig: TAKE 1 TABLET(10 MG) BY MOUTH DAILY     Cardiovascular: Calcium Channel Blockers 2 Failed - 07/31/2023  7:57 AM      Failed - Last BP in normal range    BP Readings from Last 1 Encounters:  11/15/22 (!) 146/96         Passed - Last Heart Rate in normal range    Pulse Readings from Last 1 Encounters:  11/15/22 65         Passed - Valid encounter within last 6 months    Recent Outpatient Visits           1 week ago Essential hypertension   [place] [facility] [place], Salvadore Oxford, NP   8 months ago Encounter for general adult medical examination with abnormal findings   [place] [place], Salvadore Oxford, NP   1 year ago Essential hypertension   [place] [facility] [place], Salvadore Oxford, NP   1 year ago Hypertensive emergency   [place] [facility] [place], Salvadore Oxford, NP       Future Appointments             In 2 months Deirdre Evener, MD [facility]

## 2023-08-08 LAB — JAK2 V617F MUTATION ANALYSIS: JAK2 V617F Mutation: NOT DETECTED

## 2023-08-08 LAB — IRON,TIBC AND FERRITIN PANEL
%SAT: 38 % (calc) (ref 20–48)
Ferritin: 176 ng/mL (ref 24–380)
Iron: 124 ug/dL (ref 50–180)
TIBC: 323 ug/dL (ref 250–425)

## 2023-08-08 LAB — CBC WITH DIFFERENTIAL/PLATELET
Absolute Monocytes: 624 {cells}/uL (ref 200–950)
Basophils Absolute: 47 {cells}/uL (ref 0–200)
Basophils Relative: 0.6 %
Eosinophils Absolute: 474 {cells}/uL (ref 15–500)
Eosinophils Relative: 6 %
HCT: 49.3 % (ref 38.5–50.0)
Hemoglobin: 16.3 g/dL (ref 13.2–17.1)
Lymphs Abs: 1359 {cells}/uL (ref 850–3900)
MCH: 31.3 pg (ref 27.0–33.0)
MCHC: 33.1 g/dL (ref 32.0–36.0)
MCV: 94.6 fL (ref 80.0–100.0)
MPV: 12.2 fL (ref 7.5–12.5)
Monocytes Relative: 7.9 %
Neutro Abs: 5396 {cells}/uL (ref 1500–7800)
Neutrophils Relative %: 68.3 %
Platelets: 185 10*3/uL (ref 140–400)
RBC: 5.21 10*6/uL (ref 4.20–5.80)
RDW: 13.1 % (ref 11.0–15.0)
Total Lymphocyte: 17.2 %
WBC: 7.9 10*3/uL (ref 3.8–10.8)

## 2023-08-08 LAB — ERYTHROPOIETIN: Erythropoietin: 4.6 m[IU]/mL (ref 2.6–18.5)

## 2023-08-08 LAB — PATHOLOGIST SMEAR REVIEW

## 2023-08-22 ENCOUNTER — Telehealth: Payer: Self-pay | Admitting: Internal Medicine

## 2023-08-22 NOTE — Addendum Note (Signed)
Addended by: Lorre Munroe on: 08/22/2023 09:21 AM   Modules accepted: Orders

## 2023-08-22 NOTE — Telephone Encounter (Signed)
Tired to cal pt back to schedule lab appt,after dropped call, no answer

## 2023-08-24 ENCOUNTER — Other Ambulatory Visit: Payer: BC Managed Care – PPO

## 2023-08-24 DIAGNOSIS — D751 Secondary polycythemia: Secondary | ICD-10-CM

## 2023-08-25 LAB — CBC WITH DIFFERENTIAL/PLATELET
Absolute Monocytes: 566 {cells}/uL (ref 200–950)
Basophils Absolute: 39 {cells}/uL (ref 0–200)
Basophils Relative: 0.6 %
Eosinophils Absolute: 527 {cells}/uL — ABNORMAL HIGH (ref 15–500)
Eosinophils Relative: 8.1 %
HCT: 50.7 % — ABNORMAL HIGH (ref 38.5–50.0)
Hemoglobin: 16.6 g/dL (ref 13.2–17.1)
Lymphs Abs: 1612 {cells}/uL (ref 850–3900)
MCH: 31.4 pg (ref 27.0–33.0)
MCHC: 32.7 g/dL (ref 32.0–36.0)
MCV: 96 fL (ref 80.0–100.0)
MPV: 12.2 fL (ref 7.5–12.5)
Monocytes Relative: 8.7 %
Neutro Abs: 3757 {cells}/uL (ref 1500–7800)
Neutrophils Relative %: 57.8 %
Platelets: 205 10*3/uL (ref 140–400)
RBC: 5.28 10*6/uL (ref 4.20–5.80)
RDW: 13.3 % (ref 11.0–15.0)
Total Lymphocyte: 24.8 %
WBC: 6.5 10*3/uL (ref 3.8–10.8)

## 2023-08-25 LAB — COMPLETE METABOLIC PANEL WITH GFR
AG Ratio: 1.9 (calc) (ref 1.0–2.5)
ALT: 18 U/L (ref 9–46)
AST: 17 U/L (ref 10–35)
Albumin: 4.8 g/dL (ref 3.6–5.1)
Alkaline phosphatase (APISO): 93 U/L (ref 35–144)
BUN: 20 mg/dL (ref 7–25)
CO2: 27 mmol/L (ref 20–32)
Calcium: 9.9 mg/dL (ref 8.6–10.3)
Chloride: 100 mmol/L (ref 98–110)
Creat: 0.91 mg/dL (ref 0.70–1.35)
Globulin: 2.5 g/dL (ref 1.9–3.7)
Glucose, Bld: 97 mg/dL (ref 65–99)
Potassium: 4.9 mmol/L (ref 3.5–5.3)
Sodium: 135 mmol/L (ref 135–146)
Total Bilirubin: 1 mg/dL (ref 0.2–1.2)
Total Protein: 7.3 g/dL (ref 6.1–8.1)
eGFR: 92 mL/min/{1.73_m2} (ref >=60)

## 2023-08-25 LAB — CK: Total CK: 105 U/L (ref 44–196)

## 2023-08-25 LAB — LACTATE DEHYDROGENASE: LDH: 143 U/L (ref 120–250)

## 2023-09-20 ENCOUNTER — Ambulatory Visit (INDEPENDENT_AMBULATORY_CARE_PROVIDER_SITE_OTHER): Payer: Medicare Other | Admitting: Internal Medicine

## 2023-09-20 VITALS — BP 136/82 | HR 73 | Temp 98.0°F | Resp 18 | Ht 67.0 in | Wt 179.3 lb

## 2023-09-20 DIAGNOSIS — I10 Essential (primary) hypertension: Secondary | ICD-10-CM

## 2023-09-20 DIAGNOSIS — R7303 Prediabetes: Secondary | ICD-10-CM

## 2023-09-20 DIAGNOSIS — I2510 Atherosclerotic heart disease of native coronary artery without angina pectoris: Secondary | ICD-10-CM | POA: Diagnosis not present

## 2023-09-20 DIAGNOSIS — Z789 Other specified health status: Secondary | ICD-10-CM | POA: Diagnosis not present

## 2023-09-20 DIAGNOSIS — E7800 Pure hypercholesterolemia, unspecified: Secondary | ICD-10-CM | POA: Diagnosis not present

## 2023-09-20 MED ORDER — REPATHA SURECLICK 140 MG/ML ~~LOC~~ SOAJ: 140.0000 mg | SUBCUTANEOUS | 2 refills | Status: DC

## 2023-09-20 NOTE — Progress Notes (Signed)
New Patient Office Visit  Subjective    Patient ID: Thomas Farley, male    DOB: [birth year]  Age: 67 y.o. MRN: [id number]  CC:  Chief Complaint  Patient presents with   Establish Care   Hyperlipidemia    HPI Thomas Farley presents to establish care  Hypertension: -Medications: Amlodipine 10 mg -Patient is compliant with above medications and reports no side effects. -Denies any SOB, CP, vision changes, LE edema or symptoms of hypotension  HLD/hx of cardiac stent: -Medications: nothing currently - had been on Zetia and Crestor with last labs in December - no longer on due to liver elevation which did resolve after being of medications. Has been off medications at this point for 3 months. -Has a history of 2 cardiac stents placed in the late 90s -Last lipid panel: Lipid Panel     Component Value Date/Time   CHOL 165 11/15/2022 1459   TRIG 143 11/15/2022 1459   HDL 63 11/15/2022 1459   CHOLHDL 2.6 11/15/2022 1459   VLDL 59.4 (H) 12/23/2020 1539   LDLCALC 78 11/15/2022 1459   LDLDIRECT 114.0 12/23/2020 1539   The 10-year ASCVD risk score (Arnett DK, et al., 2019) is: 14.9%   Values used to calculate the score:     Age: 3 years     Sex: Male     Is Non-Hispanic African American: No     Diabetic: No     Tobacco smoker: No     Systolic Blood Pressure: 136 mmHg     Is BP treated: Yes     HDL Cholesterol: 63 mg/dL     Total Cholesterol: 165 mg/dL   Pre-Diabetes: -Last W0J 12/23 6.1% -Not currently on medications  GERD: -Currently on Protonix 20 mg, taking every other day currently, no symptoms   Health Maintenance: -Blood work due -Colon cancer screening - colonoscopy within last 10 years, obtaining records   Outpatient Encounter Medications as of 09/20/2023  Medication Sig   amLODipine (NORVASC) 10 MG tablet TAKE 1 TABLET(10 MG) BY MOUTH DAILY   aspirin 81 MG tablet Take 81 mg by mouth daily.   pantoprazole (PROTONIX) 20 MG tablet Take 1 tablet (20 mg  total) by mouth daily.   ezetimibe (ZETIA) 10 MG tablet TAKE 1 TABLET(10 MG) BY MOUTH DAILY (Patient not taking: Reported on 09/20/2023)   rosuvastatin (CRESTOR) 40 MG tablet TAKE 1 TABLET(40 MG) BY MOUTH DAILY (Patient not taking: Reported on 09/20/2023)   [DISCONTINUED] folic acid (FOLVITE) 1 MG tablet Take 1 tablet (1 mg total) by mouth daily.   No facility-administered encounter medications on file as of 09/20/2023.    Past Medical History:  Diagnosis Date   GERD (gastroesophageal reflux disease)    Hyperlipidemia    Hypertension     Past Surgical History:  Procedure Laterality Date   CAROTID STENT INSERTION  12/13/1998   MANDIBLE RECONSTRUCTION  12/13/1973    Family History  Problem Relation Age of Onset   Alzheimer's disease Mother    Prostate cancer Father    Hyperlipidemia Father    Heart disease Father    Alzheimer's disease Father     Social History   Socioeconomic History   Marital status: Married    Spouse name: Not on file   Number of children: Not on file   Years of education: Not on file   Highest education level: Bachelor's degree (e.g., BA, AB, BS)  Occupational History   Not on file  Tobacco Use  Smoking status: Never   Smokeless tobacco: Never  Vaping Use   Vaping status: Never Used  Substance and Sexual Activity   Alcohol use: Yes    Alcohol/week: 7.0 standard drinks of alcohol    Types: 3 Glasses of wine, 3 Cans of beer, 1 Standard drinks or equivalent per week    Comment: up to 5 times weekly---beer or wine   Drug use: No   Sexual activity: Yes  Other Topics Concern   Not on file  Social History Narrative   Not on file   Social Determinants of Health   Financial Resource Strain: Low Risk  (09/20/2023)   Overall Financial Resource Strain (CARDIA)    Difficulty of Paying Living Expenses: Not hard at all  Food Insecurity: No Food Insecurity (09/20/2023)   Hunger Vital Sign    Worried About Running Out of Food in the Last Year: Never true     Ran Out of Food in the Last Year: Never true  Transportation Needs: No Transportation Needs (09/20/2023)   PRAPARE - Administrator, Civil Service (Medical): No    Lack of Transportation (Non-Medical): No  Physical Activity: Sufficiently Active (09/20/2023)   Exercise Vital Sign    Days of Exercise per Week: 6 days    Minutes of Exercise per Session: 50 min  Stress: No Stress Concern Present (09/20/2023)   [employer] of Occupational Health - Occupational Stress Questionnaire    Feeling of Stress : Not at all  Social Connections: Socially Integrated (09/20/2023)   Social Connection and Isolation Panel [NHANES]    Frequency of Communication with Friends and Family: Three times a week    Frequency of Social Gatherings with Friends and Family: More than three times a week    Attends Religious Services: More than 4 times per year    Active Member of [employer] or Organizations: Yes    Attends Engineer, structural: More than 4 times per year    Marital Status: Married  Catering manager Violence: Not on file    Review of Systems  All other systems reviewed and are negative.       Objective    BP 136/82   Pulse 73   Temp 98 F (36.7 C)   Resp 18   Ht 5\' 7"  (1.702 m)   Wt 179 lb 4.8 oz (81.3 kg)   SpO2 100%   BMI 28.08 kg/m   Physical Exam Constitutional:      Appearance: Normal appearance.  HENT:     Head: Normocephalic and atraumatic.     Mouth/Throat:     Mouth: Mucous membranes are moist.     Pharynx: Oropharynx is clear.  Eyes:     Extraocular Movements: Extraocular movements intact.     Conjunctiva/sclera: Conjunctivae normal.     Pupils: Pupils are equal, round, and reactive to light.  Neck:     Vascular: No carotid bruit.  Cardiovascular:     Rate and Rhythm: Normal rate and regular rhythm.  Pulmonary:     Effort: Pulmonary effort is normal.     Breath sounds: Normal breath sounds.  Skin:    General: Skin is warm and dry.   Neurological:     General: No focal deficit present.     Mental Status: He is alert. Mental status is at baseline.  Psychiatric:        Mood and Affect: Mood normal.        Behavior: Behavior normal.  Assessment & Plan:   1. Essential hypertension: BP stable, continue Amlodipine 10 mg.  2. Pure hypercholesterolemia/Coronary artery disease involving native coronary artery of native heart without angina pectoris/Statin intolerance: Patient no longer on statin or Zetia due to intolerance, will recheck lipid panel and prescribe Repatha.   - Evolocumab (REPATHA SURECLICK) 140 MG/ML SOAJ; Inject 140 mg into the skin every 14 (fourteen) days.  Dispense: 2 mL; Refill: 2 - Lipid Profile  3. Prediabetes: Recheck A1c today.  - HgB A1c   Return in 3 months (on 12/21/2023).   Margarita Mail, DO

## 2023-09-20 NOTE — Patient Instructions (Addendum)
It was great seeing you today!  Plan discussed at today's visit: -Blood work ordered today, results will be uploaded to MyChart.  -Prescribed Reptha to cholesterol, see info below   Follow up in: 3 months  Take care and let us know if you have any questions or concerns prior to your next visit.  Dr. Caralee Ates  Evolocumab Injection What is this medication? EVOLOCUMAB (e voe LOK ue mab) treats high cholesterol. It may also be used to lower the risk of heart attack, stroke, and a type of heart surgery. It works by decreasing bad cholesterol (such as LDL) in your blood. It is a monoclonal antibody. Changes to diet and exercise are often combined with this medication. This medicine may be used for other purposes; ask your health care provider or pharmacist if you have questions. COMMON BRAND NAME(S): Repatha, Repatha SureClick What should I tell my care team before I take this medication? They need to know if you have any of these conditions: An unusual or allergic reaction to evolocumab, latex, other medications, foods, dyes, or preservatives Pregnant or trying to get pregnant Breast-feeding How should I use this medication? This medication is injected under the skin. You will be taught how to prepare and give it. Take it as directed on the prescription label at the same time every day. Keep taking it unless your care team tells you to stop. It is important that you put your used needles and syringes in a special sharps container. Do not put them in a trash can. If you do not have a sharps container, call your pharmacist or care team to get one. This medication comes with INSTRUCTIONS FOR USE. Ask your pharmacist for directions on how to use this medication. Read the information carefully. Talk to your pharmacist or care team if you have questions. Talk to your care team about the use of this medication in children. While it may be prescribed for children as young as 10 years for selected  conditions, precautions do apply. Overdosage: If you think you have taken too much of this medicine contact a poison control center or emergency room at once. NOTE: This medicine is only for you. Do not share this medicine with others. What if I miss a dose? It is important not to miss any doses. Talk to your care team about what to do if you miss a dose. What may interact with this medication? Interactions are not expected. This list may not describe all possible interactions. Give your health care provider a list of all the medicines, herbs, non-prescription drugs, or dietary supplements you use. Also tell them if you smoke, drink alcohol, or use illegal drugs. Some items may interact with your medicine. What should I watch for while using this medication? Visit your care team for regular checks on your progress. Tell your care team if your symptoms do not start to get better or if they get worse. You may need blood work while you are taking this medication. Do not wear the on-body infuser during an MRI. Taking this medication is only part of a total heart healthy program. Ask your care team if there are other changes you can make to improve your overall health. What side effects may I notice from receiving this medication? Side effects that you should report to your care team as soon as possible: Allergic reactions or angioedema--skin rash, itching or hives, swelling of the face, eyes, lips, tongue, arms, or legs, trouble swallowing or breathing Side effects that  usually do not require medical attention (report to your care team if they continue or are bothersome): Back pain Flu-like symptoms--fever, chills, muscle pain, cough, headache, fatigue Pain, redness, or irritation at injection site Runny or stuffy nose Sore throat This list may not describe all possible side effects. Call your doctor for medical advice about side effects. You may report side effects to FDA at 1-800-FDA-1088. Where  should I keep my medication? Keep out of the reach of children and pets. Store in a refrigerator or at room temperature between 20 and 25 degrees C (68 and 77 degrees F). Refrigeration (preferred): Store it in the refrigerator. Do not freeze. Keep it in the original carton until you are ready to take it. Remove the dose from the carton about 30 minutes before it is time for you to take it. Get rid of any unused medicationAml after the expiration date. Room temperature: This medication may be stored at room temperature for up to 30 days. Keep it in the original carton until you are ready to take it. If it is stored at room temperature, get rid of any unused medication after 30 days or after it expires, whichever is first. Protect from light. Do not shake. Avoid exposure to extreme heat. To get rid of medications that are no longer needed or have expired: Take the medication to a medication take-back program. Check with your pharmacy or law enforcement to find a location. If you cannot return the medication, ask your pharmacist or care team how to get rid of this medication safely. NOTE: This sheet is a summary. It may not cover all possible information. If you have questions about this medicine, talk to your doctor, pharmacist, or health care provider.  2024 Elsevier/Gold Standard (2022-01-08 00:00:00)

## 2023-09-21 LAB — LIPID PANEL
Cholesterol: 351 mg/dL — ABNORMAL HIGH (ref <200)
HDL: 61 mg/dL (ref >=40)
LDL Cholesterol (Calc): 262 mg/dL — ABNORMAL HIGH
Non-HDL Cholesterol (Calc): 290 mg/dL — ABNORMAL HIGH (ref <130)
Total CHOL/HDL Ratio: 5.8 (calc) — ABNORMAL HIGH (ref <5.0)
Triglycerides: 133 mg/dL (ref <150)

## 2023-09-21 LAB — HEMOGLOBIN A1C
Hgb A1c MFr Bld: 5.8 %{Hb} — ABNORMAL HIGH (ref <5.7)
Mean Plasma Glucose: 120 mg/dL
eAG (mmol/L): 6.6 mmol/L

## 2023-10-12 ENCOUNTER — Ambulatory Visit: Payer: Medicare Other | Admitting: Dermatology

## 2023-10-12 DIAGNOSIS — L820 Inflamed seborrheic keratosis: Secondary | ICD-10-CM | POA: Diagnosis not present

## 2023-10-12 DIAGNOSIS — L821 Other seborrheic keratosis: Secondary | ICD-10-CM

## 2023-10-12 DIAGNOSIS — L578 Other skin changes due to chronic exposure to nonionizing radiation: Secondary | ICD-10-CM | POA: Diagnosis not present

## 2023-10-12 DIAGNOSIS — Z7189 Other specified counseling: Secondary | ICD-10-CM

## 2023-10-12 DIAGNOSIS — Z5111 Encounter for antineoplastic chemotherapy: Secondary | ICD-10-CM

## 2023-10-12 DIAGNOSIS — L570 Actinic keratosis: Secondary | ICD-10-CM

## 2023-10-12 DIAGNOSIS — Z79899 Other long term (current) drug therapy: Secondary | ICD-10-CM

## 2023-10-12 DIAGNOSIS — W908XXA Exposure to other nonionizing radiation, initial encounter: Secondary | ICD-10-CM

## 2023-10-12 DIAGNOSIS — L814 Other melanin hyperpigmentation: Secondary | ICD-10-CM

## 2023-10-12 NOTE — Progress Notes (Signed)
Follow-Up Visit   Subjective  Thomas Farley is a 67 y.o. male who presents for the following: patient here today concerning a rough spot at right side of face he noticed a year ago.   Denies family or personal history of skin cancer   The patient has spots, moles and lesions to be evaluated, some may be new or changing and the patient may have concern these could be cancer.   The following portions of the chart were reviewed this encounter and updated as appropriate: medications, allergies, medical history  Review of Systems:  No other skin or systemic complaints except as noted in HPI or Assessment and Plan.  Objective  Well appearing patient in no apparent distress; mood and affect are within normal limits.   A focused examination was performed of the following areas: Face, ears, b/l arms and hands, and back  Relevant exam findings are noted in the Assessment and Plan.  face and ear x 20 (20) Erythematous thin papules/macules with gritty scale.   face and ears x 23 (23) Erythematous stuck-on, waxy papule or plaque    Assessment & Plan    ACTINIC DAMAGE WITH PRECANCEROUS ACTINIC KERATOSES Counseling for Topical Chemotherapy Management: Patient exhibits: - Severe, confluent actinic changes with pre-cancerous actinic keratoses that is secondary to cumulative UV radiation exposure over time - Condition that is severe; chronic, not at goal. - diffuse scaly erythematous macules and papules with underlying dyspigmentation - Discussed Prescription "Field Treatment" topical Chemotherapy for Severe, Chronic Confluent Actinic Changes with Pre-Cancerous Actinic Keratoses Field treatment involves treatment of an entire area of skin that has confluent Actinic Changes (Sun/ Ultraviolet light damage) and PreCancerous Actinic Keratoses by method of PhotoDynamic Therapy (PDT) and/or prescription Topical Chemotherapy agents such as 5-fluorouracil, 5-fluorouracil/calcipotriene, and/or  imiquimod.  The purpose is to decrease the number of clinically evident and subclinical PreCancerous lesions to prevent progression to development of skin cancer by chemically destroying early precancer changes that may or may not be visible.  It has been shown to reduce the risk of developing skin cancer in the treated area. As a result of treatment, redness, scaling, crusting, and open sores may occur during treatment course. One or more than one of these methods may be used and may have to be used several times to control, suppress and eliminate the PreCancerous changes. Discussed treatment course, expected reaction, and possible side effects. - Recommend daily broad spectrum sunscreen SPF 30+ to sun-exposed areas, reapply every 2 hours as needed.  - Staying in the shade or wearing long sleeves, sun glasses (UVA+UVB protection) and wide brim hats (4-inch brim around the entire circumference of the hat) are also recommended. - Call for new or changing lesions.  Discussed may start 88f/u cream treatment at next follow up to treat areas at face   Actinic keratosis (20) face and ear x 20  Discussed may consider 78f/u cream treatment in future at next follow up to treat areas on face   Actinic keratoses are precancerous spots that appear secondary to cumulative UV radiation exposure/sun exposure over time. They are chronic with expected duration over 1 year. A portion of actinic keratoses will progress to squamous cell carcinoma of the skin. It is not possible to reliably predict which spots will progress to skin cancer and so treatment is recommended to prevent development of skin cancer.  Recommend daily broad spectrum sunscreen SPF 30+ to sun-exposed areas, reapply every 2 hours as needed.  Recommend staying in the shade or  wearing long sleeves, sun glasses (UVA+UVB protection) and wide brim hats (4-inch brim around the entire circumference of the hat). Call for new or changing  lesions.  Destruction of lesion - face and ear x 20 (20) Complexity: simple   Destruction method: cryotherapy   Informed consent: discussed and consent obtained   Timeout:  patient name, date of birth, surgical site, and procedure verified Lesion destroyed using liquid nitrogen: Yes   Region frozen until ice ball extended beyond lesion: Yes   Outcome: patient tolerated procedure well with no complications   Post-procedure details: wound care instructions given    Inflamed seborrheic keratosis (23) face and ears x 23  Symptomatic, irritating, patient would like treated.  Destruction of lesion - face and ears x 23 (23) Complexity: simple   Destruction method: cryotherapy   Informed consent: discussed and consent obtained   Timeout:  patient name, date of birth, surgical site, and procedure verified Lesion destroyed using liquid nitrogen: Yes   Region frozen until ice ball extended beyond lesion: Yes   Outcome: patient tolerated procedure well with no complications   Post-procedure details: wound care instructions given     SEBORRHEIC KERATOSIS - Stuck-on, waxy, tan-brown papules and/or plaques  - Benign-appearing - Discussed benign etiology and prognosis. - Observe - Call for any changes  LENTIGINES Exam: scattered tan macules Due to sun exposure Treatment Plan: Benign-appearing, observe. Recommend daily broad spectrum sunscreen SPF 30+ to sun-exposed areas, reapply every 2 hours as needed.  Call for any changes      Return for 4 - 8 month ak and isk follow up.  IAsher Muir, CMA, am acting as scribe for Armida Sans, MD.   Documentation: I have reviewed the above documentation for accuracy and completeness, and I agree with the above.  Armida Sans, MD

## 2023-10-12 NOTE — Patient Instructions (Addendum)
Actinic keratoses are precancerous spots that appear secondary to cumulative UV radiation exposure/sun exposure over time. They are chronic with expected duration over 1 year. A portion of actinic keratoses will progress to squamous cell carcinoma of the skin. It is not possible to reliably predict which spots will progress to skin cancer and so treatment is recommended to prevent development of skin cancer.  Recommend daily broad spectrum sunscreen SPF 30+ to sun-exposed areas, reapply every 2 hours as needed.  Recommend staying in the shade or wearing long sleeves, sun glasses (UVA+UVB protection) and wide brim hats (4-inch brim around the entire circumference of the hat). Call for new or changing lesions.     Cryotherapy Aftercare  Wash gently with soap and water everyday.   Apply Vaseline and Band-Aid daily until healed.     Seborrheic Keratosis  What causes seborrheic keratoses? Seborrheic keratoses are harmless, common skin growths that first appear during adult life.  As time goes by, more growths appear.  Some people may develop a large number of them.  Seborrheic keratoses appear on both covered and uncovered body parts.  They are not caused by sunlight.  The tendency to develop seborrheic keratoses can be inherited.  They vary in color from skin-colored to gray, brown, or even black.  They can be either smooth or have a rough, warty surface.   Seborrheic keratoses are superficial and look as if they were stuck on the skin.  Under the microscope this type of keratosis looks like layers upon layers of skin.  That is why at times the top layer may seem to fall off, but the rest of the growth remains and re-grows.    Treatment Seborrheic keratoses do not need to be treated, but can easily be removed in the office.  Seborrheic keratoses often cause symptoms when they rub on clothing or jewelry.  Lesions can be in the way of shaving.  If they become inflamed, they can cause itching,  soreness, or burning.  Removal of a seborrheic keratosis can be accomplished by freezing, burning, or surgery. If any spot bleeds, scabs, or grows rapidly, please return to have it checked, as these can be an indication of a skin cancer.    Due to recent changes in healthcare laws, you may see results of your pathology and/or laboratory studies on MyChart before the doctors have had a chance to review them. We understand that in some cases there may be results that are confusing or concerning to you. Please understand that not all results are received at the same time and often the doctors may need to interpret multiple results in order to provide you with the best plan of care or course of treatment. Therefore, we ask that you please give [place] 2 business days to thoroughly review all your results before contacting the office for clarification. Should we see a critical lab result, you will be contacted sooner.   If You Need Anything After Your Visit  If you have any questions or concerns for your doctor, please call our main line at [phone] and press option 4 to reach your doctor's medical assistant. If no one answers, please leave a voicemail as directed and we will return your call as soon as possible. Messages left after 4 pm will be answered the following business day.   You may also send [place] a message via MyChart. We typically respond to MyChart messages within 1-2 business days.  For prescription refills, please ask your pharmacy to contact  our office. Our fax number is [phone].  If you have an urgent issue when the clinic is closed that cannot wait until the next business day, you can page your doctor at the number below.    Please note that while we do our best to be available for urgent issues outside of office hours, we are not available 24/7.   If you have an urgent issue and are unable to reach [place], you may choose to seek medical care at your doctor's office, retail clinic,  urgent care center, or emergency room.  If you have a medical emergency, please immediately call 911 or go to the emergency department.  Pager Numbers  - Dr. Gwen Pounds: [phone]  - Dr. Roseanne Reno: [phone]  - Dr. Katrinka Blazing: [phone]   In the event of inclement weather, please call our main line at [phone] for an update on the status of any delays or closures.  Dermatology Medication Tips: Please keep the boxes that topical medications come in in order to help keep track of the instructions about where and how to use these. Pharmacies typically print the medication instructions only on the boxes and not directly on the medication tubes.   If your medication is too expensive, please contact our office at [phone] option 4 or send [place] a message through MyChart.   We are unable to tell what your co-pay for medications will be in advance as this is different depending on your insurance coverage. However, we may be able to find a substitute medication at lower cost or fill out paperwork to get insurance to cover a needed medication.   If a prior authorization is required to get your medication covered by your insurance company, please allow [place] 1-2 business days to complete this process.  Drug prices often vary depending on where the prescription is filled and some pharmacies may offer cheaper prices.  The website www.goodrx.com contains coupons for medications through different pharmacies. The prices here do not account for what the cost may be with help from insurance (it may be cheaper with your insurance), but the website can give you the price if you did not use any insurance.  - You can print the associated coupon and take it with your prescription to the pharmacy.  - You may also stop by our office during regular business hours and pick up a GoodRx coupon card.  - If you need your prescription sent electronically to a different pharmacy, notify our office through [facility] or by phone at [phone] option 4.     Si Usted Necesita Algo Despus de Su Visita  Tambin puede enviarnos un mensaje a travs de Clinical cytogeneticist. Por lo general respondemos a los mensajes de MyChart en el transcurso de 1 a 2 das hbiles.  Para renovar recetas, por favor pida a su farmacia que se ponga en contacto con nuestra oficina. Annie Sable de fax es [place] [phone].  Si tiene un asunto urgente cuando la clnica est cerrada y que no puede esperar hasta el siguiente da hbil, puede llamar/localizar a su doctor(a) al nmero que aparece a continuacin.   Por favor, tenga en cuenta que aunque hacemos todo lo posible para estar disponibles para asuntos urgentes fuera del horario de [place], no estamos disponibles las 24 horas del da, los 7 [address]  [address] de la [place].   Si tiene un problema urgente y no puede comunicarse con nosotros, puede optar por buscar atencin mdica  en el consultorio de su doctor(a), en una clnica  privada, en un centro de atencin urgente o en una sala de emergencias.  Si tiene Engineer, drilling, por favor llame inmediatamente al 911 o vaya a la sala de emergencias.  Nmeros de bper  - Dr. Gwen Pounds: [phone]  - Dra. Roseanne Reno: [phone]  - Dr. Katrinka Blazing: [phone]   En caso de inclemencias del tiempo, por favor llame a Lacy Duverney principal al [phone] para una actualizacin sobre el [place] de cualquier retraso o cierre.  Consejos para la medicacin en dermatologa: Por favor, guarde las cajas en las que vienen los medicamentos de uso tpico para ayudarle a seguir las instrucciones sobre dnde y cmo usarlos. Las farmacias generalmente imprimen las instrucciones del medicamento slo en las cajas y no directamente en los tubos del [place].   Si su medicamento es muy caro, por favor, pngase en contacto con Rolm Gala llamando al [phone] y presione la opcin 4 o envenos un mensaje a travs de Clinical cytogeneticist.   No podemos decirle cul  ser su copago por los medicamentos por adelantado ya que esto es diferente dependiendo de la cobertura de su seguro. Sin embargo, es posible que podamos encontrar un medicamento sustituto a Audiological scientist un formulario para que el seguro cubra el medicamento que se considera necesario.   Si se requiere una autorizacin previa para que su compaa de seguros [place] su medicamento, por favor permtanos de 1 a 2 das hbiles para completar [address]  Los precios de los medicamentos varan con frecuencia dependiendo del Environmental consultant de dnde se surte la receta y alguna farmacias pueden ofrecer precios ms baratos.  El sitio web www.goodrx.com tiene cupones para medicamentos de Health and safety inspector. Los precios aqu no tienen en cuenta lo que podra costar con la ayuda del seguro (puede ser ms barato con su seguro), pero el sitio web puede darle el precio si no utiliz Tourist information centre manager.  - Puede imprimir el cupn correspondiente y llevarlo con su receta a la farmacia.  - Tambin puede pasar por nuestra oficina durante el horario de atencin regular y Education officer, museum una tarjeta de cupones de GoodRx.  - Si necesita que su receta se enve electrnicamente a una farmacia diferente, informe a nuestra oficina a travs de MyChart de [place] o por telfono llamando al [phone] y presione la opcin 4.

## 2023-10-14 ENCOUNTER — Ambulatory Visit: Payer: BC Managed Care – PPO | Admitting: Internal Medicine

## 2023-10-23 ENCOUNTER — Encounter: Payer: Self-pay | Admitting: Dermatology

## 2023-10-25 ENCOUNTER — Telehealth: Payer: Self-pay | Admitting: Internal Medicine

## 2023-10-25 NOTE — Telephone Encounter (Signed)
Called patient no longer needed

## 2023-10-25 NOTE — Telephone Encounter (Signed)
Copied from CRM [id number]. Topic: General - Other >> Oct 25, 2023 11:43 AM Franchot Heidelberg wrote: Reason for CRM: Pt called requested for something in writing that states he is able to take Repatha. Please advise

## 2023-10-27 ENCOUNTER — Other Ambulatory Visit: Payer: Self-pay | Admitting: Internal Medicine

## 2023-10-27 DIAGNOSIS — I2510 Atherosclerotic heart disease of native coronary artery without angina pectoris: Secondary | ICD-10-CM

## 2023-10-27 DIAGNOSIS — Z789 Other specified health status: Secondary | ICD-10-CM

## 2023-10-27 DIAGNOSIS — E7800 Pure hypercholesterolemia, unspecified: Secondary | ICD-10-CM

## 2023-10-27 NOTE — Telephone Encounter (Signed)
Requested Prescriptions  Refused Prescriptions Disp Refills   REPATHA SURECLICK 140 MG/ML SOAJ [Pharmacy Med Name: REPATHA 140 MG/ML SURECLICK]  1    Sig: INJECT 140 MG INTO THE SKIN EVERY 14 (FOURTEEN) DAYS.     Cardiovascular: PCSK9 Inhibitors Passed - 10/27/2023  9:31 AM      Passed - Valid encounter within last 12 months    Recent Outpatient Visits           1 month ago Essential hypertension   [facility] Margarita Mail, DO   3 months ago Essential hypertension   [facility] [place], Salvadore Oxford, NP   11 months ago Encounter for general adult medical examination with abnormal findings   [place] [place], Salvadore Oxford, NP   1 year ago Essential hypertension   [facility] [place], Salvadore Oxford, NP   1 year ago Hypertensive emergency   [facility] [place], Salvadore Oxford, NP       Future Appointments             In 1 month Margarita Mail, DO [place]   In 3 months Deirdre Evener, MD [facility]            Passed - Lipid Panel completed within the last 12 months    Cholesterol  Date Value Ref Range Status  09/20/2023 351 (H) <200 mg/dL Final   LDL Cholesterol (Calc)  Date Value Ref Range Status  09/20/2023 262 (H) mg/dL (calc) Final    Comment:    LDL-C levels > or = 190 mg/dL may indicate familial  hypercholesterolemia (FH). Clinical assessment and  measurement of blood lipid levels should be  considered for all first degree relatives of patients with an FH diagnosis. LDL Cholesterol (LDL-C) levels > or = 300 mg/dL may indicate homozygous familial hypercholesterolemia (HoFH). Untreated,  these extremely high LDL-C levels can result in premature CV events and mortality. Patients should be identified early and provided appropriate interventions to reduce the cumulative LDL-C burden  from birth. . For questions about testing for familial hypercholesterolemia, please call Engineer, materials at 1.866.GENE.INFO. Wardell Honour, et al. J National Lipid Association  Recommendations for Patient-Centered Management of Dyslipidemia: Part 1 Journal of  Clinical Lipidology 2015;9(2), 129-169. Cuchel, M. et al. (2014). Homozygous familial hypercholesterolaemia: new insights and guidance for clinicians to improve detection and clinical management. European Heart Journal, 35(32), [phone]. Reference range: <100 . Desirable range <100 mg/dL for primary prevention;   <70 mg/dL for patients with CHD or diabetic patients  with > or = 2 CHD risk factors. Marland Kitchen LDL-C is now calculated using the Martin-Hopkins  calculation, which is a validated novel method providing  better accuracy than the Friedewald equation in the  estimation of LDL-C.  Horald Pollen et al. Lenox Ahr. 5409;811(91): 2061-2068  (http://education.QuestDiagnostics.com/faq/FAQ164)    Direct LDL  Date Value Ref Range Status  12/23/2020 114.0 mg/dL Final    Comment:    Optimal:  <100 mg/dLNear or Above Optimal:  100-129 mg/dLBorderline High:  130-159 mg/dLHigh:  160-189 mg/dLVery High:  >190 mg/dL   HDL  Date Value Ref Range Status  09/20/2023 61 > OR = 40 mg/dL Final   Triglycerides  Date Value Ref Range Status  09/20/2023 133 <150 mg/dL Final

## 2023-12-08 ENCOUNTER — Other Ambulatory Visit: Payer: Self-pay | Admitting: Internal Medicine

## 2023-12-09 NOTE — Telephone Encounter (Signed)
Change of pharmacy Requested Prescriptions  Pending Prescriptions Disp Refills   amLODipine (NORVASC) 10 MG tablet [Pharmacy Med Name: AMLODIPINE BESYLATE 10 MG TAB] 30 tablet 1    Sig: TAKE 1 TABLET BY MOUTH EVERY DAY     Cardiovascular: Calcium Channel Blockers 2 Passed - 12/09/2023 10:29 AM      Passed - Last BP in normal range    BP Readings from Last 1 Encounters:  09/20/23 136/82         Passed - Last Heart Rate in normal range    Pulse Readings from Last 1 Encounters:  09/20/23 73         Passed - Valid encounter within last 6 months    Recent Outpatient Visits           2 months ago Essential hypertension   [facility] Margarita Mail, DO   4 months ago Essential hypertension   [facility] [place], Salvadore Oxford, NP   1 year ago Encounter for general adult medical examination with abnormal findings   [facility] [place], Salvadore Oxford, NP   1 year ago Essential hypertension   [facility] [place], Salvadore Oxford, NP   1 year ago Hypertensive emergency   [facility] [place], Salvadore Oxford, NP       Future Appointments             In 1 week Margarita Mail, DO [facility]   In 2 months Deirdre Evener, MD [facility]

## 2023-12-21 ENCOUNTER — Ambulatory Visit (INDEPENDENT_AMBULATORY_CARE_PROVIDER_SITE_OTHER): Payer: Medicare Other | Admitting: Internal Medicine

## 2023-12-21 ENCOUNTER — Other Ambulatory Visit: Payer: Self-pay

## 2023-12-21 VITALS — BP 132/80 | HR 81 | Temp 97.8°F | Resp 16 | Ht 67.0 in | Wt 187.1 lb

## 2023-12-21 DIAGNOSIS — I10 Essential (primary) hypertension: Secondary | ICD-10-CM | POA: Diagnosis not present

## 2023-12-21 DIAGNOSIS — Z789 Other specified health status: Secondary | ICD-10-CM | POA: Diagnosis not present

## 2023-12-21 DIAGNOSIS — I2510 Atherosclerotic heart disease of native coronary artery without angina pectoris: Secondary | ICD-10-CM | POA: Diagnosis not present

## 2023-12-21 DIAGNOSIS — E7800 Pure hypercholesterolemia, unspecified: Secondary | ICD-10-CM | POA: Diagnosis not present

## 2023-12-21 DIAGNOSIS — B351 Tinea unguium: Secondary | ICD-10-CM | POA: Insufficient documentation

## 2023-12-21 DIAGNOSIS — K2100 Gastro-esophageal reflux disease with esophagitis, without bleeding: Secondary | ICD-10-CM

## 2023-12-21 DIAGNOSIS — R7303 Prediabetes: Secondary | ICD-10-CM

## 2023-12-21 MED ORDER — REPATHA SURECLICK 140 MG/ML ~~LOC~~ SOAJ
140.0000 mg | SUBCUTANEOUS | 1 refills | Status: DC
Start: 2023-12-21 — End: 2024-06-05

## 2023-12-21 MED ORDER — PANTOPRAZOLE SODIUM 20 MG PO TBEC
20.0000 mg | DELAYED_RELEASE_TABLET | Freq: Every day | ORAL | 1 refills | Status: DC
Start: 2023-12-21 — End: 2024-06-13

## 2023-12-21 MED ORDER — AMLODIPINE BESYLATE 10 MG PO TABS
10.0000 mg | ORAL_TABLET | Freq: Every day | ORAL | 1 refills | Status: DC
Start: 2023-12-21 — End: 2024-06-13

## 2023-12-21 NOTE — Progress Notes (Signed)
 Established Patient Office Visit  Subjective   Patient ID: Thomas Farley, male    DOB: [birth year]  Age: 68 y.o. MRN: [id number]  Chief Complaint  Patient presents with   Medical Management of Chronic Issues    3 month follow up    HPI  Patient is here for follow up on chronic medical conditions. He has been doing well since our LOV. He does have some toenail issues he would like to discuss. Has ongoing fungal infection for years, on 8-9 toe nails. Tried over the counter medication but has not been successful.   Hypertension: -Medications: Amlodipine  10 mg -Patient is compliant with above medications and reports no side effects. -Denies any SOB, CP, vision changes, LE edema or symptoms of hypotension  HLD/hx of cardiac stent: -Medications: started on Repatha  since LOV - had been on Zetia  and Crestor  with last labs in December - no longer on due to liver elevation which did resolve after being of medications. -Doing well with the Repatha  so far, was expensive at first but now with the new year it is much more affordable, tolerating well, no side effects. -Has a history of 2 cardiac stents placed in the late 90s -Last lipid panel: Lipid Panel     Component Value Date/Time   CHOL 351 (H) 09/20/2023 1215   TRIG 133 09/20/2023 1215   HDL 61 09/20/2023 1215   CHOLHDL 5.8 (H) 09/20/2023 1215   VLDL 59.4 (H) 12/23/2020 1539   LDLCALC 262 (H) 09/20/2023 1215   LDLDIRECT 114.0 12/23/2020 1539    Hx of Pre-Diabetes: -Last A1c 10/24 5.8% -Not currently on medications  GERD: -Currently on Protonix  20 mg, taking every other day currently, no symptoms   Health Maintenance: -Blood work UTD -Colon cancer screening - colonoscopy within last 10 years, obtaining records  -Discussed getting Shingles and Tdap at the pharmacy  Patient Active Problem List   Diagnosis Date Noted   Coronary artery disease involving native coronary artery of native heart without angina pectoris 12/21/2023    Statin intolerance 12/21/2023   Onychomycosis 12/21/2023   Polycythemia 07/22/2023   Prediabetes 07/22/2023   Overweight with body mass index (BMI) of 28 to 28.9 in adult 01/27/2022   Esophageal reflux 08/21/2015   HLD (hyperlipidemia) 08/21/2015   Essential hypertension 08/21/2015   Past Medical History:  Diagnosis Date   GERD (gastroesophageal reflux disease)    Hyperlipidemia    Hypertension    Past Surgical History:  Procedure Laterality Date   CAROTID STENT INSERTION  12/13/1998   MANDIBLE RECONSTRUCTION  12/13/1973   Social History   Tobacco Use   Smoking status: Never   Smokeless tobacco: Never  Vaping Use   Vaping status: Never Used  Substance Use Topics   Alcohol use: Yes    Alcohol/week: 7.0 standard drinks of alcohol    Types: 3 Glasses of wine, 3 Cans of beer, 1 Standard drinks or equivalent per week    Comment: up to 5 times weekly---beer or wine   Drug use: No   Social History   Socioeconomic History   Marital status: Married    Spouse name: Not on file   Number of children: Not on file   Years of education: Not on file   Highest education level: Bachelor's degree (e.g., BA, AB, BS)  Occupational History   Not on file  Tobacco Use   Smoking status: Never   Smokeless tobacco: Never  Vaping Use   Vaping status: Never Used  Substance  and Sexual Activity   Alcohol use: Yes    Alcohol/week: 7.0 standard drinks of alcohol    Types: 3 Glasses of wine, 3 Cans of beer, 1 Standard drinks or equivalent per week    Comment: up to 5 times weekly---beer or wine   Drug use: No   Sexual activity: Yes  Other Topics Concern   Not on file  Social History Narrative   Not on file   Social Drivers of Health   Financial Resource Strain: Low Risk  (12/21/2023)   Overall Financial Resource Strain (CARDIA)    Difficulty of Paying Living Expenses: Not hard at all  Food Insecurity: No Food Insecurity (12/21/2023)   Hunger Vital Sign    Worried About Running Out of  Food in the Last Year: Never true    Ran Out of Food in the Last Year: Never true  Transportation Needs: No Transportation Needs (12/21/2023)   PRAPARE - Administrator, Civil Service (Medical): No    Lack of Transportation (Non-Medical): No  Physical Activity: Sufficiently Active (12/21/2023)   Exercise Vital Sign    Days of Exercise per Week: 6 days    Minutes of Exercise per Session: 60 min  Stress: No Stress Concern Present (12/21/2023)   [employer] of Occupational Health - Occupational Stress Questionnaire    Feeling of Stress : Not at all  Social Connections: Socially Integrated (12/21/2023)   Social Connection and Isolation Panel [NHANES]    Frequency of Communication with Friends and Family: More than three times a week    Frequency of Social Gatherings with Friends and Family: More than three times a week    Attends Religious Services: More than 4 times per year    Active Member of [employer] or Organizations: Yes    Attends Engineer, Structural: More than 4 times per year    Marital Status: Married  Catering Manager Violence: Not on file   Family Status  Relation Name Status   Mother  Deceased   Father MUSA REWERTS Deceased  No partnership data on file   Family History  Problem Relation Age of Onset   Alzheimer's disease Mother    Prostate cancer Father    Hyperlipidemia Father    Heart disease Father    Alzheimer's disease Father    No Known Allergies    Review of Systems  All other systems reviewed and are negative.     Objective:     BP 132/80 (Cuff Size: Large)   Pulse 81   Temp 97.8 F (36.6 C) (Oral)   Resp 16   Ht 5' 7 (1.702 m)   Wt 187 lb 1.6 oz (84.9 kg)   SpO2 97%   BMI 29.30 kg/m  BP Readings from Last 3 Encounters:  12/21/23 132/80  09/20/23 136/82  11/15/22 (!) 146/96   Wt Readings from Last 3 Encounters:  12/21/23 187 lb 1.6 oz (84.9 kg)  09/20/23 179 lb 4.8 oz (81.3 kg)  11/15/22 184 lb (83.5 kg)      Physical  Exam Constitutional:      Appearance: Normal appearance.  HENT:     Head: Normocephalic and atraumatic.  Eyes:     Conjunctiva/sclera: Conjunctivae normal.  Cardiovascular:     Rate and Rhythm: Normal rate and regular rhythm.  Pulmonary:     Effort: Pulmonary effort is normal.     Breath sounds: Normal breath sounds.  Skin:    General: Skin is warm and dry.  Comments: Fungal disease present on toenails  Neurological:     General: No focal deficit present.     Mental Status: He is alert. Mental status is at baseline.  Psychiatric:        Mood and Affect: Mood normal.        Behavior: Behavior normal.      No results found for any visits on 12/21/23.  Last CBC Lab Results  Component Value Date   WBC 6.5 08/24/2023   HGB 16.6 08/24/2023   HCT 50.7 (H) 08/24/2023   MCV 96.0 08/24/2023   MCH 31.4 08/24/2023   RDW 13.3 08/24/2023   PLT 205 08/24/2023   Last metabolic panel Lab Results  Component Value Date   GLUCOSE 97 08/24/2023   NA 135 08/24/2023   K 4.9 08/24/2023   CL 100 08/24/2023   CO2 27 08/24/2023   BUN 20 08/24/2023   CREATININE 0.91 08/24/2023   EGFR 92 08/24/2023   CALCIUM  9.9 08/24/2023   PROT 7.3 08/24/2023   ALBUMIN 4.7 01/19/2022   BILITOT 1.0 08/24/2023   ALKPHOS 61 01/19/2022   AST 17 08/24/2023   ALT 18 08/24/2023   ANIONGAP 5 01/20/2022   Last lipids Lab Results  Component Value Date   CHOL 351 (H) 09/20/2023   HDL 61 09/20/2023   LDLCALC 262 (H) 09/20/2023   LDLDIRECT 114.0 12/23/2020   TRIG 133 09/20/2023   CHOLHDL 5.8 (H) 09/20/2023   Last hemoglobin A1c Lab Results  Component Value Date   HGBA1C 5.8 (H) 09/20/2023   Last thyroid functions No results found for: TSH, T3TOTAL, T4TOTAL, THYROIDAB Last vitamin D No results found for: 25OHVITD2, 25OHVITD3, VD25OH Last vitamin B12 and Folate No results found for: VITAMINB12, FOLATE    The ASCVD Risk score (Arnett DK, et al., 2019) failed to calculate for  the following reasons:   The valid total cholesterol range is 130 to 320 mg/dL    Assessment & Plan:  Essential hypertension Assessment & Plan: Blood pressure stable here today, no changes made to medications and appropriate refills sent to pharmacy.    Orders: -     amLODIPine  Besylate; Take 1 tablet (10 mg total) by mouth daily.  Dispense: 90 tablet; Refill: 1  Pure hypercholesterolemia Assessment & Plan: Now on Repatha , plan to recheck labs at follow up.   Orders: -     Repatha  SureClick; Inject 140 mg into the skin every 14 (fourteen) days.  Dispense: 6 mL; Refill: 1  Coronary artery disease involving native coronary artery of native heart without angina pectoris Assessment & Plan: Now on Repatha , could not tolerate statin. Hx of cardiac stents. Plan to recheck labs at follow up.   Orders: -     Repatha  SureClick; Inject 140 mg into the skin every 14 (fourteen) days.  Dispense: 6 mL; Refill: 1  Statin intolerance Assessment & Plan: History of LFT elevation with Crestor , resolved after discontinuation.   Orders: -     Repatha  SureClick; Inject 140 mg into the skin every 14 (fourteen) days.  Dispense: 6 mL; Refill: 1  Prediabetes Assessment & Plan: A1c improved in October, will recheck with labs next time.    Gastroesophageal reflux disease with esophagitis without hemorrhage Assessment & Plan: Controlled, refill Protonix .   Orders: -     Pantoprazole  Sodium; Take 1 tablet (20 mg total) by mouth daily.  Dispense: 90 tablet; Refill: 1  Onychomycosis Assessment & Plan: Patient has a history of LFT elevation with statins, hesitant to  start something that could cause the same issue. He also endorses drinking 4-5 alcoholic beverages a night. For now recommend topical Lamisil daily for 6 months, at follow up we will recheck and determine if oral Lamisil will be optimal.       Return in about 6 months (around 06/19/2024).    Sharyle Fischer, DO

## 2023-12-21 NOTE — Assessment & Plan Note (Signed)
 Controlled, refill Protonix.

## 2023-12-21 NOTE — Assessment & Plan Note (Signed)
 History of LFT elevation with Crestor, resolved after discontinuation.

## 2023-12-21 NOTE — Assessment & Plan Note (Signed)
 Now on Repatha, plan to recheck labs at follow up.

## 2023-12-21 NOTE — Assessment & Plan Note (Signed)
 Patient has a history of LFT elevation with statins, hesitant to start something that could cause the same issue. He also endorses drinking 4-5 alcoholic beverages a night. For now recommend topical Lamisil daily for 6 months, at follow up we will recheck and determine if oral Lamisil will be optimal.

## 2023-12-21 NOTE — Assessment & Plan Note (Signed)
 Now on Repatha, could not tolerate statin. Hx of cardiac stents. Plan to recheck labs at follow up.

## 2023-12-21 NOTE — Assessment & Plan Note (Signed)
 Blood pressure stable here today, no changes made to medications and appropriate refills sent to pharmacy.

## 2023-12-21 NOTE — Assessment & Plan Note (Signed)
 A1c improved in October, will recheck with labs next time.

## 2024-02-22 ENCOUNTER — Ambulatory Visit: Payer: BC Managed Care – PPO | Admitting: Dermatology

## 2024-04-04 ENCOUNTER — Ambulatory Visit: Admitting: Dermatology

## 2024-04-04 ENCOUNTER — Encounter: Payer: Self-pay | Admitting: Dermatology

## 2024-04-04 DIAGNOSIS — Z7189 Other specified counseling: Secondary | ICD-10-CM

## 2024-04-04 DIAGNOSIS — L578 Other skin changes due to chronic exposure to nonionizing radiation: Secondary | ICD-10-CM | POA: Diagnosis not present

## 2024-04-04 DIAGNOSIS — L821 Other seborrheic keratosis: Secondary | ICD-10-CM

## 2024-04-04 DIAGNOSIS — L719 Rosacea, unspecified: Secondary | ICD-10-CM | POA: Diagnosis not present

## 2024-04-04 DIAGNOSIS — W908XXA Exposure to other nonionizing radiation, initial encounter: Secondary | ICD-10-CM | POA: Diagnosis not present

## 2024-04-04 DIAGNOSIS — L820 Inflamed seborrheic keratosis: Secondary | ICD-10-CM | POA: Diagnosis not present

## 2024-04-04 DIAGNOSIS — L570 Actinic keratosis: Secondary | ICD-10-CM | POA: Diagnosis not present

## 2024-04-04 NOTE — Progress Notes (Signed)
 Follow-Up Visit   Subjective  Thomas Farley is a 68 y.o. male who presents for the following: AK 62m f/u, face, ears, ISKs 1m f/u face, ears The patient has spots, moles and lesions to be evaluated, some may be new or changing and the patient may have concern these could be cancer.  The following portions of the chart were reviewed this encounter and updated as appropriate: medications, allergies, medical history  Review of Systems:  No other skin or systemic complaints except as noted in HPI or Assessment and Plan.  Objective  Well appearing patient in no apparent distress; mood and affect are within normal limits.   A focused examination was performed of the following areas: Face, ears, arms, hands  Relevant exam findings are noted in the Assessment and Plan.  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 Condition is symptomatic / bothersome to patient. Not to goal. Rosacea is a chronic progressive skin condition usually affecting the face of adults, causing redness and/or acne bumps. It is treatable but not curable. It sometimes affects the eyes (ocular rosacea) as well. It may respond to topical and/or systemic medication and can flare with stress, sun exposure, alcohol, exercise, topical steroids (including hydrocortisone/cortisone 10) and some foods.  Daily application of broad spectrum spf 30+ sunscreen to face is recommended to reduce flares.  Patient denies grittiness of the eyes  Treatment Plan Counseling for BBL / IPL / Laser and Coordination of Care Discussed the treatment option of Broad Band Light (BBL) /Intense Pulsed Light (IPL)/ Laser for skin discoloration, including brown spots and redness.  Typically we recommend at least 1-3 treatment sessions about 5-8  weeks apart for best results.  Cannot have tanned skin when BBL performed, and regular use of sunscreen/photoprotection is advised after the procedure to help maintain results. The patient's condition may also require "maintenance treatments" in the future.  The fee for BBL / laser treatments is $350 per treatment session for the whole face.  A fee can be quoted for other parts of the body.  Insurance typically does not pay for BBL/laser treatments and therefore the fee is an out-of-pocket cost. Recommend prophylactic valtrex treatment. Once scheduled for procedure, will send Rx in prior to patient's appointment.   No treatment at this time  SEBORRHEIC KERATOSIS - Stuck-on, waxy, tan-brown papules and/or plaques  - Benign-appearing - Discussed benign etiology and prognosis. - Observe - Call for any changes  ACTINIC DAMAGE - chronic, secondary to cumulative UV radiation exposure/sun exposure over time - diffuse scaly erythematous macules with underlying dyspigmentation - Recommend daily broad spectrum sunscreen SPF 30+ to sun-exposed areas, reapply every 2 hours as needed.  - Recommend staying in the shade or wearing long sleeves, sun glasses (UVA+UVB protection) and wide brim hats (4-inch brim around the entire circumference of the hat). - Call for new or changing lesions.    AK (ACTINIC KERATOSIS) (9) face x 9 (9) Actinic keratoses are precancerous spots that appear secondary to cumulative UV radiation exposure/sun exposure over time. They are chronic with expected duration over 1 year. A portion of actinic keratoses will progress to squamous cell carcinoma of the skin. It is not possible to reliably predict which spots will progress to skin cancer and so treatment is recommended to  prevent development of skin cancer.  Recommend daily broad spectrum sunscreen SPF 30+ to sun-exposed areas, reapply every 2 hours as needed.  Recommend staying in the shade or wearing long sleeves, sun glasses  (UVA+UVB protection) and wide brim hats (4-inch brim around the entire circumference of the hat). Call for new or changing lesions. Destruction of lesion - face x 9 (9) Complexity: simple   Destruction method: cryotherapy   Informed consent: discussed and consent obtained   Timeout:  patient name, date of birth, surgical site, and procedure verified Lesion destroyed using liquid nitrogen: Yes   Region frozen until ice ball extended beyond lesion: Yes   Outcome: patient tolerated procedure well with no complications   Post-procedure details: wound care instructions given   INFLAMED SEBORRHEIC KERATOSIS (3) face x 3 (3) Symptomatic, irritating, patient would like treated. Destruction of lesion - face x 3 (3) Complexity: simple   Destruction method: cryotherapy   Informed consent: discussed and consent obtained   Timeout:  patient name, date of birth, surgical site, and procedure verified Lesion destroyed using liquid nitrogen: Yes   Region frozen until ice ball extended beyond lesion: Yes   Outcome: patient tolerated procedure well with no complications   Post-procedure details: wound care instructions given   ROSACEA   COUNSELING AND COORDINATION OF CARE   ACTINIC SKIN DAMAGE   SEBORRHEIC KERATOSIS    Return in about 1 year (around 04/04/2025) for AK f/u.  I, Rollie Clipper, RMA, am acting as scribe for Celine Collard, MD .   Documentation: I have reviewed the above documentation for accuracy and completeness, and I agree with the above.  Celine Collard, MD

## 2024-04-04 NOTE — Patient Instructions (Addendum)

## 2024-06-02 ENCOUNTER — Other Ambulatory Visit: Payer: Self-pay | Admitting: Internal Medicine

## 2024-06-02 DIAGNOSIS — E7800 Pure hypercholesterolemia, unspecified: Secondary | ICD-10-CM

## 2024-06-02 DIAGNOSIS — Z789 Other specified health status: Secondary | ICD-10-CM

## 2024-06-02 DIAGNOSIS — I2510 Atherosclerotic heart disease of native coronary artery without angina pectoris: Secondary | ICD-10-CM

## 2024-06-05 NOTE — Telephone Encounter (Signed)
 LOV 12/21/2023   Requested Prescriptions  Pending Prescriptions Disp Refills   REPATHA  SURECLICK 140 MG/ML SOAJ [Pharmacy Med Name: REPATHA  SURCLK 140MG /ML INJ LATEX] 6 mL 1    Sig: ADMINISTER 1 ML(140MG ) UNDER THE SKIN EVERY 14 DAYS     Cardiovascular: PCSK9 Inhibitors Failed - 06/05/2024  1:11 PM      Failed - Valid encounter within last 12 months    Recent Outpatient Visits   None     Future Appointments             In 10 months Thomas Alm BROCKS, MD [facility]            Passed - Lipid Panel completed within the last 12 months    Cholesterol  Date Value Ref Range Status  09/20/2023 351 (H) <200 mg/dL Final   LDL Cholesterol (Calc)  Date Value Ref Range Status  09/20/2023 262 (H) mg/dL (calc) Final    Comment:    LDL-C levels > or = 190 mg/dL may indicate familial  hypercholesterolemia (FH). Clinical assessment and  measurement of blood lipid levels should be  considered for all first degree relatives of patients with an FH diagnosis. LDL Cholesterol (LDL-C) levels > or = 300 mg/dL may indicate homozygous familial hypercholesterolemia (HoFH). Untreated,  these extremely high LDL-C levels can result in premature CV events and mortality. Patients should be identified early and provided appropriate interventions to reduce the cumulative LDL-C burden from birth. . For questions about testing for familial hypercholesterolemia, please call Engineer, materials at 1.866.GENE.INFO. [name], et al. J National Lipid Association  Recommendations for Patient-Centered Management of Dyslipidemia: Part 1 Journal of  Clinical Lipidology 2015;9(2), 129-169. Cuchel, M. et al. (2014). Homozygous familial hypercholesterolaemia: new insights and guidance for clinicians to improve detection and clinical management. European Heart Journal, 35(32), [phone]. Reference range: <100 . Desirable range <100 mg/dL for primary prevention;   <70  mg/dL for patients with CHD or diabetic patients  with > or = 2 CHD risk factors. SABRA LDL-C is now calculated using the Martin-Hopkins  calculation, which is a validated novel method providing  better accuracy than the Friedewald equation in the  estimation of LDL-C.  [name] et al. [name]. 7986;689(80): 2061-2068  (http://education.Quest[employer]ostics.com/faq/FAQ164)    Direct LDL  Date Value Ref Range Status  12/23/2020 114.0 mg/dL Final    Comment:    Optimal:  <100 mg/dLNear or Above Optimal:  100-129 mg/dLBorderline High:  130-159 mg/dLHigh:  160-189 mg/dLVery High:  >190 mg/dL   HDL  Date Value Ref Range Status  09/20/2023 61 > OR = 40 mg/dL Final   Triglycerides  Date Value Ref Range Status  09/20/2023 133 <150 mg/dL Final

## 2024-06-12 ENCOUNTER — Other Ambulatory Visit: Payer: Self-pay | Admitting: Internal Medicine

## 2024-06-12 DIAGNOSIS — K2100 Gastro-esophageal reflux disease with esophagitis, without bleeding: Secondary | ICD-10-CM

## 2024-06-12 DIAGNOSIS — I10 Essential (primary) hypertension: Secondary | ICD-10-CM

## 2024-06-13 NOTE — Telephone Encounter (Signed)
 Requested Prescriptions  Pending Prescriptions Disp Refills   pantoprazole  (PROTONIX ) 20 MG tablet [Pharmacy Med Name: PANTOPRAZOLE  20MG  TABLETS] 90 tablet 0    Sig: TAKE 1 TABLET(20 MG) BY MOUTH DAILY     Gastroenterology: Proton Pump Inhibitors Failed - 06/13/2024  5:44 PM      Failed - Valid encounter within last 12 months    Recent Outpatient Visits   None     Future Appointments             In 9 months Thomas Alm BROCKS, MD [facility]             amLODipine  (NORVASC ) 10 MG tablet [Pharmacy Med Name: AMLODIPINE  BESYLATE 10MG TABLETS] 90 tablet 0    Sig: TAKE 1 TABLET(10 MG) BY MOUTH DAILY     Cardiovascular: Calcium  Channel Blockers 2 Failed - 06/13/2024  5:44 PM      Failed - Valid encounter within last 6 months    Recent Outpatient Visits   None     Future Appointments             In 9 months Thomas Alm BROCKS, MD [facility]            Passed - Last BP in normal range    BP Readings from Last 1 Encounters:  12/21/23 132/80         Passed - Last Heart Rate in normal range    Pulse Readings from Last 1 Encounters:  12/21/23 81

## 2024-06-19 ENCOUNTER — Ambulatory Visit: Payer: Self-pay | Admitting: Internal Medicine

## 2024-06-29 ENCOUNTER — Ambulatory Visit (INDEPENDENT_AMBULATORY_CARE_PROVIDER_SITE_OTHER): Admitting: Internal Medicine

## 2024-06-29 ENCOUNTER — Other Ambulatory Visit: Payer: Self-pay

## 2024-06-29 ENCOUNTER — Encounter: Payer: Self-pay | Admitting: Internal Medicine

## 2024-06-29 VITALS — BP 118/72 | HR 78 | Temp 97.9°F | Resp 16 | Ht 67.0 in | Wt 186.7 lb

## 2024-06-29 DIAGNOSIS — R7303 Prediabetes: Secondary | ICD-10-CM

## 2024-06-29 DIAGNOSIS — I2510 Atherosclerotic heart disease of native coronary artery without angina pectoris: Secondary | ICD-10-CM

## 2024-06-29 DIAGNOSIS — E7800 Pure hypercholesterolemia, unspecified: Secondary | ICD-10-CM | POA: Diagnosis not present

## 2024-06-29 DIAGNOSIS — I10 Essential (primary) hypertension: Secondary | ICD-10-CM

## 2024-06-29 DIAGNOSIS — Z789 Other specified health status: Secondary | ICD-10-CM

## 2024-06-29 DIAGNOSIS — Z125 Encounter for screening for malignant neoplasm of prostate: Secondary | ICD-10-CM

## 2024-06-29 DIAGNOSIS — K2100 Gastro-esophageal reflux disease with esophagitis, without bleeding: Secondary | ICD-10-CM | POA: Diagnosis not present

## 2024-06-29 DIAGNOSIS — Z1211 Encounter for screening for malignant neoplasm of colon: Secondary | ICD-10-CM

## 2024-06-29 NOTE — Progress Notes (Signed)
 Established Patient Office Visit  Subjective   Patient ID: Thomas Farley, male    DOB: [birth year]  Age: 68 y.o. MRN: [id number]  Chief Complaint  Patient presents with   Medical Management of Chronic Issues    6 month recheck    HPI  Patient is here for follow up on chronic medical conditions.   Discussed the use of AI scribe software for clinical note transcription with the patient, who gave verbal consent to proceed.  History of Present Illness Thomas Farley is a 68 year old male who presents for follow-up and lab rechecks.  He is rechecking his cholesterol levels and A1c after previous abnormal results. His cholesterol was very high after stopping his medication, and he is now on Repatha  without other cholesterol medications. He has prediabetes with a previous A1c of 5.8 and fasted this morning for lab tests. There are no new symptoms related to blood sugar levels.  He takes amlodipine  for blood pressure, which is well-controlled at 118/72.  He manages acid reflux by avoiding trigger foods and uses medication as needed. There is a discussion about the need for a colonoscopy, as it has been a long time since his last one, and he has not undergone Cologuard testing.   Hypertension: -Medications: Amlodipine  10 mg -Patient is compliant with above medications and reports no side effects. -Denies any SOB, CP, vision changes, LE edema or symptoms of hypotension  HLD/hx of cardiac stent: -Medications: started on Repatha  since LOV - had been on Zetia  and Crestor  with last labs in December - no longer on due to liver elevation which did resolve after being of medications. -Doing well with the Repatha  so far, was expensive at first but now with the new year it is much more affordable, tolerating well, no side effects. -Has a history of 2 cardiac stents placed in the late 90s -Last lipid panel: Lipid Panel     Component Value Date/Time   CHOL 351 (H) 09/20/2023 1215   TRIG  133 09/20/2023 1215   HDL 61 09/20/2023 1215   CHOLHDL 5.8 (H) 09/20/2023 1215   VLDL 59.4 (H) 12/23/2020 1539   LDLCALC 262 (H) 09/20/2023 1215   LDLDIRECT 114.0 12/23/2020 1539    Hx of Pre-Diabetes: -Last A1c 10/24 5.8% -Not currently on medications  GERD: -Currently on Protonix  20 mg, taking every other day currently, no symptoms   Health Maintenance: -Blood work due -Colon cancer screening - due -Discussed getting Shingles at the pharmacy  Patient Active Problem List   Diagnosis Date Noted   Coronary artery disease involving native coronary artery of native heart without angina pectoris 12/21/2023   Statin intolerance 12/21/2023   Onychomycosis 12/21/2023   Polycythemia 07/22/2023   Prediabetes 07/22/2023   Overweight with body mass index (BMI) of 28 to 28.9 in adult 01/27/2022   Esophageal reflux 08/21/2015   HLD (hyperlipidemia) 08/21/2015   Essential hypertension 08/21/2015   Past Medical History:  Diagnosis Date   GERD (gastroesophageal reflux disease)    Hyperlipidemia    Hypertension    Past Surgical History:  Procedure Laterality Date   CAROTID STENT INSERTION  12/13/1998   MANDIBLE RECONSTRUCTION  12/13/1973   Social History   Tobacco Use   Smoking status: Never   Smokeless tobacco: Never  Vaping Use   Vaping status: Never Used  Substance Use Topics   Alcohol use: Yes    Alcohol/week: 7.0 standard drinks of alcohol    Types: 3 Glasses of wine, 3  Cans of beer, 1 Standard drinks or equivalent per week    Comment: up to 5 times weekly---beer or wine   Drug use: No   Social History   Socioeconomic History   Marital status: Married    Spouse name: Not on file   Number of children: Not on file   Years of education: Not on file   Highest education level: Bachelor's degree (e.g., BA, AB, BS)  Occupational History   Not on file  Tobacco Use   Smoking status: Never   Smokeless tobacco: Never  Vaping Use   Vaping status: Never Used  Substance  and Sexual Activity   Alcohol use: Yes    Alcohol/week: 7.0 standard drinks of alcohol    Types: 3 Glasses of wine, 3 Cans of beer, 1 Standard drinks or equivalent per week    Comment: up to 5 times weekly---beer or wine   Drug use: No   Sexual activity: Yes  Other Topics Concern   Not on file  Social History Narrative   Not on file   Social Drivers of Health   Financial Resource Strain: Low Risk  (06/28/2024)   Overall Financial Resource Strain (CARDIA)    Difficulty of Paying Living Expenses: Not hard at all  Food Insecurity: No Food Insecurity (06/28/2024)   Hunger Vital Sign    Worried About Running Out of Food in the Last Year: Never true    Ran Out of Food in the Last Year: Never true  Transportation Needs: No Transportation Needs (06/28/2024)   PRAPARE - Administrator, Civil Service (Medical): No    Lack of Transportation (Non-Medical): No  Physical Activity: Insufficiently Active (06/28/2024)   Exercise Vital Sign    Days of Exercise per Week: 3 days    Minutes of Exercise per Session: 30 min  Stress: No Stress Concern Present (06/28/2024)   [employer] of Occupational Health - Occupational Stress Questionnaire    Feeling of Stress: Not at all  Social Connections: Socially Integrated (06/28/2024)   Social Connection and Isolation Panel    Frequency of Communication with Friends and Family: More than three times a week    Frequency of Social Gatherings with Friends and Family: More than three times a week    Attends Religious Services: More than 4 times per year    Active Member of [employer] or Organizations: Yes    Attends Engineer, structural: More than 4 times per year    Marital Status: Married  Catering manager Violence: Not on file   Family Status  Relation Name Status   Mother  Deceased   Father JOHNNEY SCARLATA Deceased  No partnership data on file   Family History  Problem Relation Age of Onset   Alzheimer's disease Mother    Prostate cancer  Father    Hyperlipidemia Father    Heart disease Father    Alzheimer's disease Father    No Known Allergies    Review of Systems  All other systems reviewed and are negative.     Objective:     BP 118/72 (Cuff Size: Large)   Pulse 78   Temp 97.9 F (36.6 C) (Oral)   Resp 16   Ht 5' 7 (1.702 m)   Wt 186 lb 11.2 oz (84.7 kg)   SpO2 98%   BMI 29.24 kg/m  BP Readings from Last 3 Encounters:  06/29/24 118/72  12/21/23 132/80  09/20/23 136/82   Wt Readings from Last 3 Encounters:  06/29/24 186 lb 11.2 oz (84.7 kg)  12/21/23 187 lb 1.6 oz (84.9 kg)  09/20/23 179 lb 4.8 oz (81.3 kg)      Physical Exam Constitutional:      Appearance: Normal appearance.  HENT:     Head: Normocephalic and atraumatic.     Mouth/Throat:     Mouth: Mucous membranes are moist.     Pharynx: Oropharynx is clear.  Eyes:     Extraocular Movements: Extraocular movements intact.     Conjunctiva/sclera: Conjunctivae normal.     Pupils: Pupils are equal, round, and reactive to light.  Neck:     Comments: No thyromegaly Cardiovascular:     Rate and Rhythm: Normal rate and regular rhythm.  Pulmonary:     Effort: Pulmonary effort is normal.     Breath sounds: Normal breath sounds.  Musculoskeletal:     Cervical back: No tenderness.     Right lower leg: No edema.     Left lower leg: No edema.  Lymphadenopathy:     Cervical: No cervical adenopathy.  Skin:    General: Skin is warm and dry.     Comments: Fungal disease present on toenails  Neurological:     General: No focal deficit present.     Mental Status: He is alert. Mental status is at baseline.  Psychiatric:        Mood and Affect: Mood normal.        Behavior: Behavior normal.      No results found for any visits on 06/29/24.  Last CBC Lab Results  Component Value Date   WBC 6.5 08/24/2023   HGB 16.6 08/24/2023   HCT 50.7 (H) 08/24/2023   MCV 96.0 08/24/2023   MCH 31.4 08/24/2023   RDW 13.3 08/24/2023   PLT 205  08/24/2023   Last metabolic panel Lab Results  Component Value Date   GLUCOSE 97 08/24/2023   NA 135 08/24/2023   K 4.9 08/24/2023   CL 100 08/24/2023   CO2 27 08/24/2023   BUN 20 08/24/2023   CREATININE 0.91 08/24/2023   EGFR 92 08/24/2023   CALCIUM  9.9 08/24/2023   PROT 7.3 08/24/2023   ALBUMIN 4.7 01/19/2022   BILITOT 1.0 08/24/2023   ALKPHOS 61 01/19/2022   AST 17 08/24/2023   ALT 18 08/24/2023   ANIONGAP 5 01/20/2022   Last lipids Lab Results  Component Value Date   CHOL 351 (H) 09/20/2023   HDL 61 09/20/2023   LDLCALC 262 (H) 09/20/2023   LDLDIRECT 114.0 12/23/2020   TRIG 133 09/20/2023   CHOLHDL 5.8 (H) 09/20/2023   Last hemoglobin A1c Lab Results  Component Value Date   HGBA1C 5.8 (H) 09/20/2023   Last thyroid functions No results found for: TSH, T3TOTAL, T4TOTAL, THYROIDAB Last vitamin D No results found for: 25OHVITD2, 25OHVITD3, VD25OH Last vitamin B12 and Folate No results found for: VITAMINB12, FOLATE    The ASCVD Risk score (Arnett DK, et al., 2019) failed to calculate for the following reasons:   The valid total cholesterol range is 130 to 320 mg/dL    Assessment & Plan:   Assessment & Plan Hyperlipidemia/CAD/Statin Intolerance  Elevated LDL previously high post-medication discontinuation. Currently on Repatha , expected to be effective. Triglycerides and HDL normal. Zetia  considered pending labs. - Recheck cholesterol levels. - Continue Repatha .  Prediabetes A1c at 5.8 indicates prediabetes. Monitoring with plan to recheck A1c. - Recheck A1c.  Hypertension Blood pressure controlled at 118/72 mmHg with amlodipine . - Continue amlodipine .  Gastroesophageal  Reflux Disease (GERD) Acid reflux manageable with diet and medication. Symptoms controlled by avoiding triggers. Emphasized dietary changes to reduce medication use. - Continue current GERD medication as needed. - Encourage dietary modifications to avoid trigger  foods.  General Health Maintenance Due for colonoscopy. Cologuard discussed but colonoscopy emphasized as gold standard. Due for shingles vaccine. Up to date on other vaccinations. - Refer for colonoscopy. - Recommend shingles vaccine at pharmacy.  Follow-up Lab results to be reviewed and contact if changes needed. Next routine follow-up in six months unless issues arise. - Review lab results and contact if changes are needed. - Schedule follow-up in six months.  - CBC w/Diff/Platelet - Comprehensive Metabolic Panel (CMET) - Lipid Profile - HgB A1c - Ambulatory referral to Gastroenterology - PSA   Return in about 6 months (around 12/30/2024).    Sharyle Fischer, DO

## 2024-06-30 LAB — COMPREHENSIVE METABOLIC PANEL WITH GFR
AG Ratio: 1.7 (calc) (ref 1.0–2.5)
ALT: 19 U/L (ref 9–46)
AST: 18 U/L (ref 10–35)
Albumin: 4.5 g/dL (ref 3.6–5.1)
Alkaline phosphatase (APISO): 72 U/L (ref 35–144)
BUN: 17 mg/dL (ref 7–25)
CO2: 25 mmol/L (ref 20–32)
Calcium: 9.4 mg/dL (ref 8.6–10.3)
Chloride: 105 mmol/L (ref 98–110)
Creat: 0.8 mg/dL (ref 0.70–1.35)
Globulin: 2.7 g/dL (ref 1.9–3.7)
Glucose, Bld: 93 mg/dL (ref 65–99)
Potassium: 4.4 mmol/L (ref 3.5–5.3)
Sodium: 138 mmol/L (ref 135–146)
Total Bilirubin: 0.8 mg/dL (ref 0.2–1.2)
Total Protein: 7.2 g/dL (ref 6.1–8.1)
eGFR: 96 mL/min/1.73m2 (ref >=60)

## 2024-06-30 LAB — CBC WITH DIFFERENTIAL/PLATELET
Absolute Lymphocytes: 1615 {cells}/uL (ref 850–3900)
Absolute Monocytes: 689 {cells}/uL (ref 200–950)
Basophils Absolute: 43 {cells}/uL (ref 0–200)
Basophils Relative: 0.5 %
Eosinophils Absolute: 459 {cells}/uL (ref 15–500)
Eosinophils Relative: 5.4 %
HCT: 50.8 % — ABNORMAL HIGH (ref 38.5–50.0)
Hemoglobin: 16.7 g/dL (ref 13.2–17.1)
MCH: 31.3 pg (ref 27.0–33.0)
MCHC: 32.9 g/dL (ref 32.0–36.0)
MCV: 95.1 fL (ref 80.0–100.0)
MPV: 12.3 fL (ref 7.5–12.5)
Monocytes Relative: 8.1 %
Neutro Abs: 5695 {cells}/uL (ref 1500–7800)
Neutrophils Relative %: 67 %
Platelets: 215 Thousand/uL (ref 140–400)
RBC: 5.34 Million/uL (ref 4.20–5.80)
RDW: 13.2 % (ref 11.0–15.0)
Total Lymphocyte: 19 %
WBC: 8.5 Thousand/uL (ref 3.8–10.8)

## 2024-06-30 LAB — HEMOGLOBIN A1C
Hgb A1c MFr Bld: 5.7 % — ABNORMAL HIGH (ref <5.7)
Mean Plasma Glucose: 117 mg/dL
eAG (mmol/L): 6.5 mmol/L

## 2024-06-30 LAB — LIPID PANEL
Cholesterol: 203 mg/dL — ABNORMAL HIGH (ref <200)
HDL: 55 mg/dL (ref >=40)
LDL Cholesterol (Calc): 128 mg/dL — ABNORMAL HIGH
Non-HDL Cholesterol (Calc): 148 mg/dL — ABNORMAL HIGH (ref <130)
Total CHOL/HDL Ratio: 3.7 (calc) (ref <5.0)
Triglycerides: 98 mg/dL (ref <150)

## 2024-06-30 LAB — PSA: PSA: 1.33 ng/mL (ref <=4.00)

## 2024-07-02 ENCOUNTER — Ambulatory Visit: Payer: Self-pay | Admitting: Internal Medicine

## 2024-07-19 DIAGNOSIS — H25813 Combined forms of age-related cataract, bilateral: Secondary | ICD-10-CM | POA: Diagnosis not present

## 2024-07-19 DIAGNOSIS — H524 Presbyopia: Secondary | ICD-10-CM | POA: Diagnosis not present

## 2024-09-10 ENCOUNTER — Other Ambulatory Visit: Payer: Self-pay | Admitting: Internal Medicine

## 2024-09-10 DIAGNOSIS — I10 Essential (primary) hypertension: Secondary | ICD-10-CM

## 2024-09-10 DIAGNOSIS — K2100 Gastro-esophageal reflux disease with esophagitis, without bleeding: Secondary | ICD-10-CM

## 2024-09-12 NOTE — Telephone Encounter (Signed)
 Requested Prescriptions  Pending Prescriptions Disp Refills   pantoprazole  (PROTONIX ) 20 MG tablet [Pharmacy Med Name: PANTOPRAZOLE  20MG  TABLETS] 90 tablet 1    Sig: TAKE 1 TABLET(20 MG) BY MOUTH DAILY     Gastroenterology: Proton Pump Inhibitors Passed - 09/12/2024 11:31 AM      Passed - Valid encounter within last 12 months    Recent Outpatient Visits           2 months ago Essential hypertension   [facility] Bernardo Fend, DO       Future Appointments             In 6 months Hester Alm BROCKS, MD [facility]             amLODipine  (NORVASC ) 10 MG tablet [Pharmacy Med Name: AMLODIPINE  BESYLATE 10MG TABLETS] 90 tablet 1    Sig: TAKE 1 TABLET(10 MG) BY MOUTH DAILY     Cardiovascular: Calcium  Channel Blockers 2 Passed - 09/12/2024 11:31 AM      Passed - Last BP in normal range    BP Readings from Last 1 Encounters:  06/29/24 118/72         Passed - Last Heart Rate in normal range    Pulse Readings from Last 1 Encounters:  06/29/24 78         Passed - Valid encounter within last 6 months    Recent Outpatient Visits           2 months ago Essential hypertension   [facility] Bernardo Fend, DO       Future Appointments             In 6 months Hester Alm BROCKS, MD [facility]

## 2024-11-16 ENCOUNTER — Other Ambulatory Visit: Payer: Self-pay | Admitting: Internal Medicine

## 2024-11-16 DIAGNOSIS — I2510 Atherosclerotic heart disease of native coronary artery without angina pectoris: Secondary | ICD-10-CM

## 2024-11-16 DIAGNOSIS — Z789 Other specified health status: Secondary | ICD-10-CM

## 2024-11-16 DIAGNOSIS — E7800 Pure hypercholesterolemia, unspecified: Secondary | ICD-10-CM

## 2024-11-20 NOTE — Telephone Encounter (Signed)
 Requested Prescriptions  Pending Prescriptions Disp Refills   Evolocumab  (REPATHA  SURECLICK) 140 MG/ML SOAJ [Pharmacy Med Name: REPATHA  SRCLK 140MG /ML INJ LATEX FR] 6 mL 0    Sig: INJECT 140 MG UNDER THE SKIN EVERY 14 DAYS     Cardiovascular: PCSK9 Inhibitors Passed - 11/20/2024  9:17 AM      Passed - Valid encounter within last 12 months    Recent Outpatient Visits           4 months ago Essential hypertension    [facility] Bernardo Fend, DO       Future Appointments             In 4 months Hester Alm BROCKS, MD  [facility]            Passed - Lipid Panel completed within the last 12 months    Cholesterol  Date Value Ref Range Status  06/29/2024 203 (H) <200 mg/dL Final   LDL Cholesterol (Calc)  Date Value Ref Range Status  06/29/2024 128 (H) mg/dL (calc) Final    Comment:    Reference range: <100 . Desirable range <100 mg/dL for primary prevention;   <70 mg/dL for patients with CHD or diabetic patients  with > or = 2 CHD risk factors. SABRA LDL-C is now calculated using the Martin-Hopkins  calculation, which is a validated novel method providing  better accuracy than the Friedewald equation in the  estimation of LDL-C.  [name] et al. [name]. 7986;689(80): 2061-2068  (http://education.[facility].com/faq/FAQ164)    Direct LDL  Date Value Ref Range Status  12/23/2020 114.0 mg/dL Final    Comment:    Optimal:  <100 mg/dLNear or Above Optimal:  100-129 mg/dLBorderline High:  130-159 mg/dLHigh:  160-189 mg/dLVery High:  >190 mg/dL   HDL  Date Value Ref Range Status  06/29/2024 55 > OR = 40 mg/dL Final   Triglycerides  Date Value Ref Range Status  06/29/2024 98 <150 mg/dL Final

## 2025-01-09 ENCOUNTER — Other Ambulatory Visit (HOSPITAL_COMMUNITY): Payer: Self-pay

## 2025-01-09 ENCOUNTER — Ambulatory Visit (INDEPENDENT_AMBULATORY_CARE_PROVIDER_SITE_OTHER): Admitting: Internal Medicine

## 2025-01-09 ENCOUNTER — Other Ambulatory Visit: Payer: Self-pay

## 2025-01-09 ENCOUNTER — Other Ambulatory Visit: Payer: Self-pay | Admitting: Internal Medicine

## 2025-01-09 ENCOUNTER — Encounter: Payer: Self-pay | Admitting: Internal Medicine

## 2025-01-09 VITALS — BP 124/84 | HR 68 | Temp 97.8°F | Resp 16 | Ht 67.0 in | Wt 191.6 lb

## 2025-01-09 DIAGNOSIS — Z789 Other specified health status: Secondary | ICD-10-CM

## 2025-01-09 DIAGNOSIS — J320 Chronic maxillary sinusitis: Secondary | ICD-10-CM

## 2025-01-09 DIAGNOSIS — E7800 Pure hypercholesterolemia, unspecified: Secondary | ICD-10-CM

## 2025-01-09 DIAGNOSIS — I2510 Atherosclerotic heart disease of native coronary artery without angina pectoris: Secondary | ICD-10-CM

## 2025-01-09 DIAGNOSIS — N529 Male erectile dysfunction, unspecified: Secondary | ICD-10-CM

## 2025-01-09 DIAGNOSIS — K2100 Gastro-esophageal reflux disease with esophagitis, without bleeding: Secondary | ICD-10-CM

## 2025-01-09 DIAGNOSIS — I10 Essential (primary) hypertension: Secondary | ICD-10-CM

## 2025-01-09 DIAGNOSIS — Z1211 Encounter for screening for malignant neoplasm of colon: Secondary | ICD-10-CM

## 2025-01-09 DIAGNOSIS — R7303 Prediabetes: Secondary | ICD-10-CM

## 2025-01-09 LAB — POCT GLYCOSYLATED HEMOGLOBIN (HGB A1C): Hemoglobin A1C: 5.7 % — AB (ref 4.0–5.6)

## 2025-01-09 MED ORDER — REPATHA SURECLICK 140 MG/ML ~~LOC~~ SOAJ: 140.0000 mg | SUBCUTANEOUS | 1 refills | Status: AC

## 2025-01-09 MED ORDER — PANTOPRAZOLE SODIUM 20 MG PO TBEC: 20.0000 mg | DELAYED_RELEASE_TABLET | Freq: Every day | ORAL | 1 refills | Status: AC

## 2025-01-09 MED ORDER — TADALAFIL 5 MG PO TABS: 5.0000 mg | ORAL_TABLET | Freq: Every day | ORAL | 0 refills | Status: AC

## 2025-01-09 MED ORDER — AMLODIPINE BESYLATE 10 MG PO TABS: 10.0000 mg | ORAL_TABLET | Freq: Every day | ORAL | 1 refills | Status: AC

## 2025-01-09 NOTE — Progress Notes (Signed)
 "  Established Patient Office Visit  Subjective   Patient ID: Thomas Farley, male    DOB: 18-Apr-1956  Age: 69 y.o. MRN: [id number]  Chief Complaint  Patient presents with   Medical Management of Chronic Issues    HPI  Patient is here for follow up on chronic medical conditions.   Discussed the use of AI scribe software for clinical note transcription with the patient, who gave verbal consent to proceed.  History of Present Illness  Thomas Farley is a 69 year old male who presents for a routine follow-up visit.  He reports a recent fall at work on ice without injury. He is concerned because his brother fractured his back in a similar fall.  He has prediabetes with a prior A1c of 5.7 and is awaiting current A1c results to guide management.  His LDL decreased from 262 to 871 over the past year. He previously had liver problems on statins and is avoiding saturated fats.  He has brief episodes of vertigo when moving from lying to standing, especially with his head down. Symptoms are short-lived and resolve quickly.  He has chronic nighttime nasal obstruction with both sides blocked and associated snoring, starting after a trip to [place]  two years ago. He is not using nasal sprays or allergy medications.  He asks about the safety of ED medications given his remote history of cardiac stents. He has never used ED medication before.   Hypertension: -Medications: Amlodipine  10 mg -Patient is compliant with above medications and reports no side effects. -Denies any SOB, CP, vision changes, LE edema. Does have some symptoms of orthopedic hypotension.   HLD/hx of cardiac stent: -Medications: Repatha   - had been on Zetia  and Crestor  with last labs in December - no longer on due to liver elevation which did resolve after being of medications. -Doing well with the Repatha  -Has a history of 2 cardiac stents placed in the late 90s -Last lipid panel: Lipid Panel     Component Value  Date/Time   CHOL 203 (H) 06/29/2024 0939   TRIG 98 06/29/2024 0939   HDL 55 06/29/2024 0939   CHOLHDL 3.7 06/29/2024 0939   VLDL 59.4 (H) 12/23/2020 1539   LDLCALC 128 (H) 06/29/2024 0939   LDLDIRECT 114.0 12/23/2020 1539    Hx of Pre-Diabetes: -Last A1c 7/25 5.7% -Not currently on medications  GERD: -Currently on Protonix  20 mg, taking every other day currently, no symptoms   Health Maintenance: -Blood work UTD -Colon cancer screening - due, referral placed -Declines flu vaccine  Patient Active Problem List   Diagnosis Date Noted   Coronary artery disease involving native coronary artery of native heart without angina pectoris 12/21/2023   Statin intolerance 12/21/2023   Onychomycosis 12/21/2023   Polycythemia 07/22/2023   Prediabetes 07/22/2023   Overweight with body mass index (BMI) of 28 to 28.9 in adult 01/27/2022   Esophageal reflux 08/21/2015   HLD (hyperlipidemia) 08/21/2015   Essential hypertension 08/21/2015   Past Medical History:  Diagnosis Date   GERD (gastroesophageal reflux disease)    Hyperlipidemia    Hypertension    Past Surgical History:  Procedure Laterality Date   CAROTID STENT INSERTION  12/13/1998   MANDIBLE RECONSTRUCTION  12/13/1973   Social History   Tobacco Use   Smoking status: Never   Smokeless tobacco: Never  Vaping Use   Vaping status: Never Used  Substance Use Topics   Alcohol use: Yes    Alcohol/week: 7.0 standard drinks of alcohol  Types: 3 Glasses of wine, 3 Cans of beer, 1 Standard drinks or equivalent per week    Comment: up to 5 times weekly---beer or wine   Drug use: No   Social History   Socioeconomic History   Marital status: Married    Spouse name: Not on file   Number of children: Not on file   Years of education: Not on file   Highest education level: Bachelor's degree (e.g., BA, AB, BS)  Occupational History   Not on file  Tobacco Use   Smoking status: Never   Smokeless tobacco: Never  Vaping Use    Vaping status: Never Used  Substance and Sexual Activity   Alcohol use: Yes    Alcohol/week: 7.0 standard drinks of alcohol    Types: 3 Glasses of wine, 3 Cans of beer, 1 Standard drinks or equivalent per week    Comment: up to 5 times weekly---beer or wine   Drug use: No   Sexual activity: Yes  Other Topics Concern   Not on file  Social History Narrative   Not on file   Social Drivers of Health   Tobacco Use: Low Risk (01/09/2025)   Patient History    Smoking Tobacco Use: Never    Smokeless Tobacco Use: Never    Passive Exposure: Not on file  Financial Resource Strain: Low Risk (01/08/2025)   Overall Financial Resource Strain (CARDIA)    Difficulty of Paying Living Expenses: Not hard at all  Food Insecurity: No Food Insecurity (01/08/2025)   Epic    Worried About Radiation Protection Practitioner of Food in the Last Year: Never true    Ran Out of Food in the Last Year: Never true  Transportation Needs: No Transportation Needs (01/08/2025)   Epic    Lack of Transportation (Medical): No    Lack of Transportation (Non-Medical): No  Physical Activity: Sufficiently Active (01/08/2025)   Exercise Vital Sign    Days of Exercise per Week: 5 days    Minutes of Exercise per Session: 40 min  Stress: No Stress Concern Present (01/08/2025)   [employer] of Occupational Health - Occupational Stress Questionnaire    Feeling of Stress: Not at all  Social Connections: Socially Integrated (01/08/2025)   Social Connection and Isolation Panel    Frequency of Communication with Friends and Family: More than three times a week    Frequency of Social Gatherings with Friends and Family: More than three times a week    Attends Religious Services: More than 4 times per year    Active Member of Clubs or Organizations: Yes    Attends Banker Meetings: More than 4 times per year    Marital Status: Married  Catering Manager Violence: Not on file  Depression (PHQ2-9): Low Risk (01/09/2025)   Depression  (PHQ2-9)    PHQ-2 Score: 0  Alcohol Screen: Low Risk (01/08/2025)   Alcohol Screen    Last Alcohol Screening Score (AUDIT): 4  Housing: Low Risk (01/08/2025)   Epic    Unable to Pay for Housing in the Last Year: No    Number of Times Moved in the Last Year: 0    Homeless in the Last Year: No  Utilities: Not on file  Health Literacy: Not on file   Family Status  Relation Name Status   Mother  Deceased   Father BERTON BUTRICK Deceased  No partnership data on file   Family History  Problem Relation Age of Onset   Alzheimer's disease Mother  Prostate cancer Father    Hyperlipidemia Father    Heart disease Father    Alzheimer's disease Father    No Known Allergies    Review of Systems  Neurological:  Positive for dizziness.  All other systems reviewed and are negative.     Objective:     BP 124/84 (Cuff Size: Large)   Pulse 68   Temp 97.8 F (36.6 C) (Oral)   Resp 16   Ht 5' 7 (1.702 m)   Wt 191 lb 9.6 oz (86.9 kg)   SpO2 99%   BMI 30.01 kg/m  BP Readings from Last 3 Encounters:  01/09/25 124/84  06/29/24 118/72  12/21/23 132/80   Wt Readings from Last 3 Encounters:  01/09/25 191 lb 9.6 oz (86.9 kg)  06/29/24 186 lb 11.2 oz (84.7 kg)  12/21/23 187 lb 1.6 oz (84.9 kg)      Physical Exam Constitutional:      Appearance: Normal appearance.  HENT:     Head: Normocephalic and atraumatic.     Mouth/Throat:     Mouth: Mucous membranes are moist.     Pharynx: Oropharynx is clear.  Eyes:     Extraocular Movements: Extraocular movements intact.     Conjunctiva/sclera: Conjunctivae normal.     Pupils: Pupils are equal, round, and reactive to light.  Cardiovascular:     Rate and Rhythm: Normal rate and regular rhythm.  Pulmonary:     Effort: Pulmonary effort is normal.     Breath sounds: Normal breath sounds.  Musculoskeletal:     Right lower leg: No edema.     Left lower leg: No edema.  Skin:    General: Skin is warm and dry.  Neurological:     General:  No focal deficit present.     Mental Status: He is alert. Mental status is at baseline.  Psychiatric:        Mood and Affect: Mood normal.        Behavior: Behavior normal.      No results found for any visits on 01/09/25.  Last CBC Lab Results  Component Value Date   WBC 8.5 06/29/2024   HGB 16.7 06/29/2024   HCT 50.8 (H) 06/29/2024   MCV 95.1 06/29/2024   MCH 31.3 06/29/2024   RDW 13.2 06/29/2024   PLT 215 06/29/2024   Last metabolic panel Lab Results  Component Value Date   GLUCOSE 93 06/29/2024   NA 138 06/29/2024   K 4.4 06/29/2024   CL 105 06/29/2024   CO2 25 06/29/2024   BUN 17 06/29/2024   CREATININE 0.80 06/29/2024   EGFR 96 06/29/2024   CALCIUM  9.4 06/29/2024   PROT 7.2 06/29/2024   ALBUMIN 4.7 01/19/2022   BILITOT 0.8 06/29/2024   ALKPHOS 61 01/19/2022   AST 18 06/29/2024   ALT 19 06/29/2024   ANIONGAP 5 01/20/2022   Last lipids Lab Results  Component Value Date   CHOL 203 (H) 06/29/2024   HDL 55 06/29/2024   LDLCALC 128 (H) 06/29/2024   LDLDIRECT 114.0 12/23/2020   TRIG 98 06/29/2024   CHOLHDL 3.7 06/29/2024   Last hemoglobin A1c Lab Results  Component Value Date   HGBA1C 5.7 (H) 06/29/2024   Last thyroid functions No results found for: TSH, T3TOTAL, T4TOTAL, THYROIDAB Last vitamin D No results found for: 25OHVITD2, 25OHVITD3, VD25OH Last vitamin B12 and Folate No results found for: VITAMINB12, FOLATE    The 10-year ASCVD risk score (Arnett DK, et al., 2019) is: 16.7%  Assessment & Plan:   Assessment & Plan  Prediabetes A1c at 5.7, indicating lower end of prediabetic range. - Continue monitoring A1c every six months.  Pure hypercholesterolemia with statin intolerance Cholesterol levels improved significantly; statins contraindicated due to liver issues. - Continue current management without statins. - Advised on dietary modifications to reduce saturated fat intake.  Atherosclerotic heart disease with  prior stents Discussed cautious use of ED medication due to cardiovascular effects. - Prescribed low-dose Cialis  (5 mg) for ED, to be taken 30 minutes before sexual activity. - Instructed to monitor for chest pain and discontinue if it occurs.  Essential hypertension Blood pressure well-controlled at 124/80. - Advised having a home blood pressure cuff for monitoring during symptomatic episodes. - Continue current antihypertensive regimen.  Gastroesophageal reflux disease with esophagitis Managed with pantoprazole , no issues reported. - Continue pantoprazole  as prescribed.  Chronic sinusitis Discussed potential sleep apnea and conservative management options. - Recommended trial of nasal saline and Flonase. - Suggested trial of Claritin in the spring to assess for allergy-related symptoms. - Will consider referral to ENT if symptoms persist or worsen.  Erectile dysfunction Discussed cautious use of ED medication due to cardiovascular history. - Prescribed low-dose Cialis  (5 mg) for ED, to be taken 30 minutes before sexual activity.  General Health Maintenance Pneumonia vaccine up to date; discussed colon cancer screening options. - Proceed with colonoscopy referral for colon cancer screening.  - amLODipine  (NORVASC ) 10 MG tablet; Take 1 tablet (10 mg total) by mouth daily.  Dispense: 90 tablet; Refill: 1 - Evolocumab  (REPATHA  SURECLICK) 140 MG/ML SOAJ; Inject 140 mg into the skin every 14 (fourteen) days.  Dispense: 6 mL; Refill: 1 - POCT HgB A1C - pantoprazole  (PROTONIX ) 20 MG tablet; Take 1 tablet (20 mg total) by mouth daily.  Dispense: 90 tablet; Refill: 1 - tadalafil  (CIALIS ) 5 MG tablet; Take 1 tablet (5 mg total) by mouth daily.  Dispense: 30 tablet; Refill: 0 - Ambulatory referral to Gastroenterology   Return in about 6 months (around 07/09/2025).    Sharyle Fischer, DO "

## 2025-01-10 ENCOUNTER — Other Ambulatory Visit: Payer: Self-pay

## 2025-01-10 ENCOUNTER — Other Ambulatory Visit: Payer: Self-pay | Admitting: Internal Medicine

## 2025-01-10 ENCOUNTER — Telehealth: Payer: Self-pay | Admitting: Pharmacy Technician

## 2025-01-10 ENCOUNTER — Other Ambulatory Visit (HOSPITAL_COMMUNITY): Payer: Self-pay

## 2025-01-10 ENCOUNTER — Telehealth: Payer: Self-pay

## 2025-01-10 DIAGNOSIS — N529 Male erectile dysfunction, unspecified: Secondary | ICD-10-CM

## 2025-01-10 DIAGNOSIS — Z1211 Encounter for screening for malignant neoplasm of colon: Secondary | ICD-10-CM

## 2025-01-10 NOTE — Telephone Encounter (Signed)
 Requested Prescriptions  Refused Prescriptions Disp Refills   tadalafil  (CIALIS ) 5 MG tablet [Pharmacy Med Name: TADALAFIL  5MG  TABLETS] 90 tablet     Sig: TAKE 1 TABLET(5 MG) BY MOUTH DAILY     Urology: Erectile Dysfunction Agents Passed - 01/10/2025 11:34 AM      Passed - AST in normal range and within 360 days    AST  Date Value Ref Range Status  06/29/2024 18 10 - 35 U/L Final         Passed - ALT in normal range and within 360 days    ALT  Date Value Ref Range Status  06/29/2024 19 9 - 46 U/L Final         Passed - Last BP in normal range    BP Readings from Last 1 Encounters:  01/09/25 124/84         Passed - Valid encounter within last 12 months    Recent Outpatient Visits           Yesterday Essential hypertension   [facility] Bernardo Fend, DO   6 months ago Essential hypertension   [facility] Bernardo Fend, DO       Future Appointments             In 2 months Hester Alm BROCKS, MD [facility]

## 2025-01-10 NOTE — Telephone Encounter (Signed)
 Pharmacy Patient Advocate Encounter  Received notification from [facility] MEDD that Prior Authorization for Tadalafil  5MG  tablets has been DENIED.  No reason given; No denial letter received via Fax or CMM. It has been requested and will be uploaded to the media tab once received.    PA #/Case ID/Reference #: [id number]

## 2025-01-10 NOTE — Telephone Encounter (Signed)
 Gastroenterology Pre-Procedure Review  Request Date: 04/22/25 Requesting Physician: Dr. Theophilus  PATIENT REVIEW QUESTIONS: The patient responded to the following health history questions as indicated:    1. Are you having any GI issues? no 2. Do you have a personal history of Polyps? no 3. Do you have a family history of Colon Cancer or Polyps? no 4. Diabetes Mellitus? no 5. Joint replacements in the past 12 months?no 6. Major health problems in the past 3 months?no 7. Any artificial heart valves, MVP, or defibrillator?no    MEDICATIONS & ALLERGIES:    Patient reports the following regarding taking any anticoagulation/antiplatelet therapy:   Plavix, Coumadin, Eliquis, Xarelto, Lovenox, Pradaxa, Brilinta, or Effient? no Aspirin ? Yes 81 mg daily  Patient confirms/reports the following medications:  Current Outpatient Medications  Medication Sig Dispense Refill   aspirin  81 MG tablet Take 81 mg by mouth daily.     amLODipine  (NORVASC ) 10 MG tablet Take 1 tablet (10 mg total) by mouth daily. 90 tablet 1   Evolocumab  (REPATHA  SURECLICK) 140 MG/ML SOAJ Inject 140 mg into the skin every 14 (fourteen) days. 6 mL 1   pantoprazole  (PROTONIX ) 20 MG tablet Take 1 tablet (20 mg total) by mouth daily. 90 tablet 1   tadalafil  (CIALIS ) 5 MG tablet Take 1 tablet (5 mg total) by mouth daily. 30 tablet 0   No current facility-administered medications for this visit.    Patient confirms/reports the following allergies:  Allergies[1]  No orders of the defined types were placed in this encounter.   AUTHORIZATION INFORMATION Primary Insurance: 1D#: Group #:  Secondary Insurance: 1D#: Group #:  SCHEDULE INFORMATION: Date: 04/22/25 Time: Location: [facility]    [1] No Known Allergies

## 2025-01-10 NOTE — Progress Notes (Signed)
 Gastroenterology Pre-Procedure Review  Request Date: 04/22/25 Requesting Physician: Dr. Theophilus  PATIENT REVIEW QUESTIONS: The patient responded to the following health history questions as indicated:    1. Are you having any GI issues? no 2. Do you have a personal history of Polyps? no 3. Do you have a family history of Colon Cancer or Polyps? no 4. Diabetes Mellitus? no 5. Joint replacements in the past 12 months?no 6. Major health problems in the past 3 months?no 7. Any artificial heart valves, MVP, or defibrillator?no    MEDICATIONS & ALLERGIES:    Patient reports the following regarding taking any anticoagulation/antiplatelet therapy:   Plavix, Coumadin, Eliquis, Xarelto, Lovenox, Pradaxa, Brilinta, or Effient? no Aspirin ? Yes 81 mg daily  Patient confirms/reports the following medications:  Current Outpatient Medications  Medication Sig Dispense Refill   amLODipine  (NORVASC ) 10 MG tablet Take 1 tablet (10 mg total) by mouth daily. 90 tablet 1   aspirin  81 MG tablet Take 81 mg by mouth daily.     Evolocumab  (REPATHA  SURECLICK) 140 MG/ML SOAJ Inject 140 mg into the skin every 14 (fourteen) days. 6 mL 1   pantoprazole  (PROTONIX ) 20 MG tablet Take 1 tablet (20 mg total) by mouth daily. 90 tablet 1   tadalafil  (CIALIS ) 5 MG tablet Take 1 tablet (5 mg total) by mouth daily. 30 tablet 0   No current facility-administered medications for this visit.    Patient confirms/reports the following allergies:  Allergies[1]  No orders of the defined types were placed in this encounter.   AUTHORIZATION INFORMATION Primary Insurance: 1D#: Group #:  Secondary Insurance: 1D#: Group #:  SCHEDULE INFORMATION: Date: 04/22/25 Time: Location: [facility]    [1] No Known Allergies

## 2025-01-10 NOTE — Telephone Encounter (Signed)
 Pharmacy Patient Advocate Encounter   Received notification from [facility] KEY that prior authorization for Tadalafil  5MG  tablets  is required/requested.   Insurance verification completed.   The patient is insured through [facility].   Per test claim: PA required; PA started via CoverMyMeds. KEY [id number] . Waiting for clinical questions to populate.

## 2025-01-11 ENCOUNTER — Other Ambulatory Visit (HOSPITAL_COMMUNITY): Payer: Self-pay

## 2025-01-11 NOTE — Telephone Encounter (Signed)
 Refilled 01/09/25 # 30. Requested Prescriptions  Refused Prescriptions Disp Refills   tadalafil  (CIALIS ) 5 MG tablet [Pharmacy Med Name: TADALAFIL  5MG  TABLETS] 90 tablet     Sig: TAKE 1 TABLET(5 MG) BY MOUTH DAILY     Urology: Erectile Dysfunction Agents Passed - 01/11/2025 12:18 PM      Passed - AST in normal range and within 360 days    AST  Date Value Ref Range Status  06/29/2024 18 10 - 35 U/L Final         Passed - ALT in normal range and within 360 days    ALT  Date Value Ref Range Status  06/29/2024 19 9 - 46 U/L Final         Passed - Last BP in normal range    BP Readings from Last 1 Encounters:  01/09/25 124/84         Passed - Valid encounter within last 12 months    Recent Outpatient Visits           2 days ago Essential hypertension   [facility] Bernardo Fend, DO   6 months ago Essential hypertension   [facility] Bernardo Fend, DO       Future Appointments             In 2 months Hester Alm BROCKS, MD [facility]

## 2025-04-04 ENCOUNTER — Ambulatory Visit: Admitting: Dermatology

## 2025-04-22 ENCOUNTER — Ambulatory Visit: Admit: 2025-04-22

## 2025-07-09 ENCOUNTER — Ambulatory Visit: Admitting: Internal Medicine
